# Patient Record
Sex: Female | Born: 1960 | Race: White | Hispanic: No | Marital: Married | Smoking: Current every day smoker
Health system: Southern US, Community
[De-identification: ages and names within clinical notes are randomized; demographics above are authoritative.]

## PROBLEM LIST (undated history)

## (undated) DIAGNOSIS — R32 Unspecified urinary incontinence: Secondary | ICD-10-CM

## (undated) DIAGNOSIS — Z8619 Personal history of other infectious and parasitic diseases: Secondary | ICD-10-CM

## (undated) DIAGNOSIS — Z8679 Personal history of other diseases of the circulatory system: Secondary | ICD-10-CM

## (undated) DIAGNOSIS — I471 Supraventricular tachycardia, unspecified: Secondary | ICD-10-CM

## (undated) DIAGNOSIS — D219 Benign neoplasm of connective and other soft tissue, unspecified: Secondary | ICD-10-CM

## (undated) DIAGNOSIS — N83209 Unspecified ovarian cyst, unspecified side: Secondary | ICD-10-CM

## (undated) DIAGNOSIS — I10 Essential (primary) hypertension: Secondary | ICD-10-CM

## (undated) DIAGNOSIS — D249 Benign neoplasm of unspecified breast: Secondary | ICD-10-CM

## (undated) DIAGNOSIS — N816 Rectocele: Secondary | ICD-10-CM

## (undated) DIAGNOSIS — F419 Anxiety disorder, unspecified: Secondary | ICD-10-CM

## (undated) DIAGNOSIS — E039 Hypothyroidism, unspecified: Secondary | ICD-10-CM

## (undated) DIAGNOSIS — F32A Depression, unspecified: Secondary | ICD-10-CM

## (undated) DIAGNOSIS — IMO0002 Reserved for concepts with insufficient information to code with codable children: Principal | ICD-10-CM

## (undated) DIAGNOSIS — F329 Major depressive disorder, single episode, unspecified: Secondary | ICD-10-CM

## (undated) HISTORY — DX: Hypothyroidism, unspecified: E03.9

## (undated) HISTORY — DX: Reserved for concepts with insufficient information to code with codable children: IMO0002

## (undated) HISTORY — DX: Essential (primary) hypertension: I10

## (undated) HISTORY — DX: Personal history of other diseases of the circulatory system: Z86.79

## (undated) HISTORY — DX: Supraventricular tachycardia, unspecified: I47.10

## (undated) HISTORY — DX: Major depressive disorder, single episode, unspecified: F32.9

## (undated) HISTORY — DX: Anxiety disorder, unspecified: F41.9

## (undated) HISTORY — DX: Unspecified urinary incontinence: R32

## (undated) HISTORY — DX: Personal history of other infectious and parasitic diseases: Z86.19

## (undated) HISTORY — DX: Unspecified ovarian cyst, unspecified side: N83.209

## (undated) HISTORY — DX: Depression, unspecified: F32.A

## (undated) HISTORY — DX: Benign neoplasm of unspecified breast: D24.9

## (undated) HISTORY — DX: Rectocele: N81.6

## (undated) HISTORY — DX: Benign neoplasm of connective and other soft tissue, unspecified: D21.9

## (undated) HISTORY — DX: Supraventricular tachycardia: I47.1

---

## 1994-03-16 HISTORY — PX: OTHER SURGICAL HISTORY: SHX169

## 2004-04-08 ENCOUNTER — Ambulatory Visit: Payer: Self-pay | Admitting: Cardiology

## 2004-04-15 ENCOUNTER — Ambulatory Visit: Payer: Self-pay | Admitting: Cardiology

## 2004-04-25 ENCOUNTER — Ambulatory Visit: Payer: Self-pay | Admitting: Cardiology

## 2005-04-07 ENCOUNTER — Ambulatory Visit: Payer: Self-pay | Admitting: Cardiology

## 2006-05-17 ENCOUNTER — Ambulatory Visit: Payer: Self-pay | Admitting: Cardiology

## 2007-04-12 ENCOUNTER — Ambulatory Visit (HOSPITAL_COMMUNITY): Admission: RE | Admit: 2007-04-12 | Discharge: 2007-04-12 | Payer: Self-pay | Admitting: Obstetrics & Gynecology

## 2007-04-21 ENCOUNTER — Other Ambulatory Visit: Admission: RE | Admit: 2007-04-21 | Discharge: 2007-04-21 | Payer: Self-pay | Admitting: Obstetrics and Gynecology

## 2007-05-20 ENCOUNTER — Ambulatory Visit (HOSPITAL_COMMUNITY): Admission: RE | Admit: 2007-05-20 | Discharge: 2007-05-20 | Payer: Self-pay | Admitting: Obstetrics & Gynecology

## 2007-07-28 ENCOUNTER — Ambulatory Visit: Payer: Self-pay | Admitting: Cardiology

## 2007-12-23 DIAGNOSIS — R0989 Other specified symptoms and signs involving the circulatory and respiratory systems: Secondary | ICD-10-CM | POA: Insufficient documentation

## 2007-12-23 DIAGNOSIS — I471 Supraventricular tachycardia: Secondary | ICD-10-CM | POA: Insufficient documentation

## 2008-01-12 ENCOUNTER — Ambulatory Visit (HOSPITAL_COMMUNITY): Admission: RE | Admit: 2008-01-12 | Discharge: 2008-01-12 | Payer: Self-pay | Admitting: Obstetrics & Gynecology

## 2008-02-16 ENCOUNTER — Encounter: Payer: Self-pay | Admitting: Cardiology

## 2008-08-22 ENCOUNTER — Ambulatory Visit: Payer: Self-pay | Admitting: Cardiology

## 2008-12-30 IMAGING — US US PELVIS COMPLETE MODIFY
1 series · 14 of 25 positions shown · non-contrast
Comparison: none

HISTORY: Secondary amenorrhea, LMP 01/08/2007

[Series 1: us pelvis complete modify · 0.26mm/px · 14 of 51 slices shown]
[im 1/51]
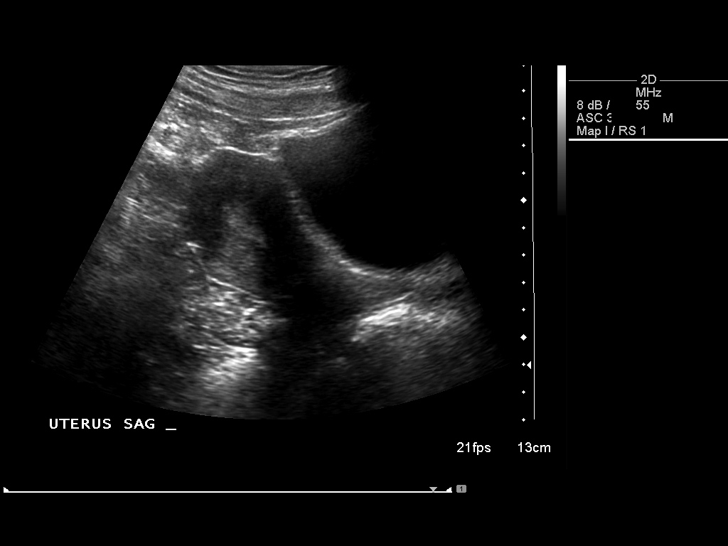
[im 5/51]
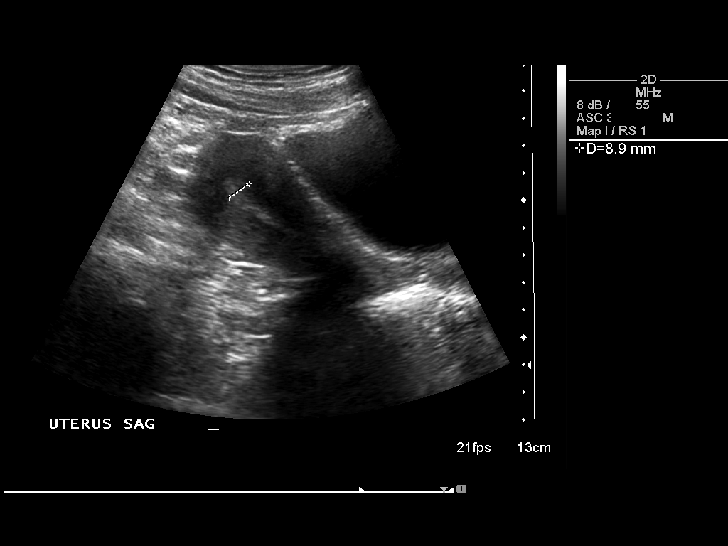
[im 9/51]
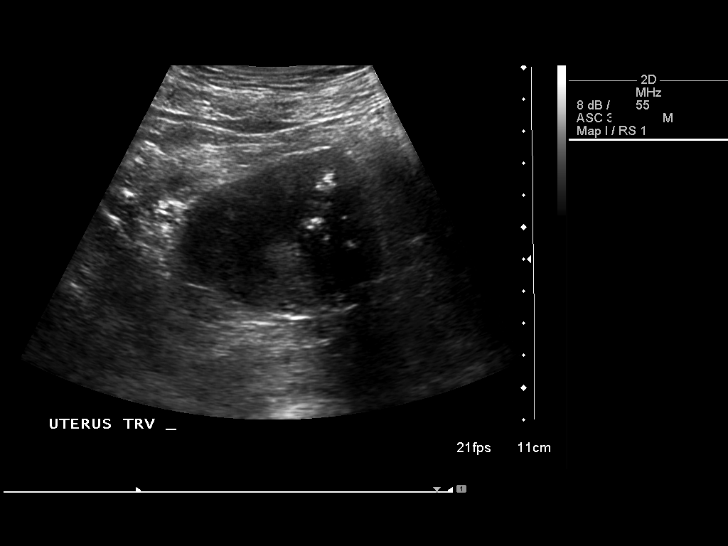
[im 13/51]
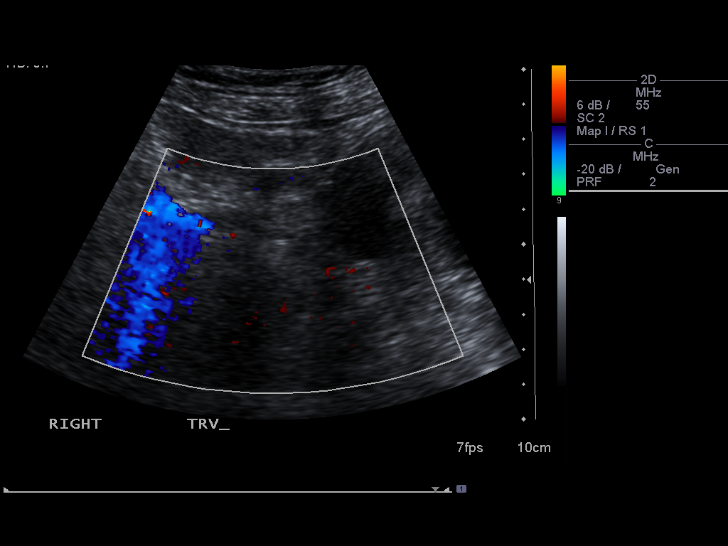
[im 17/51]
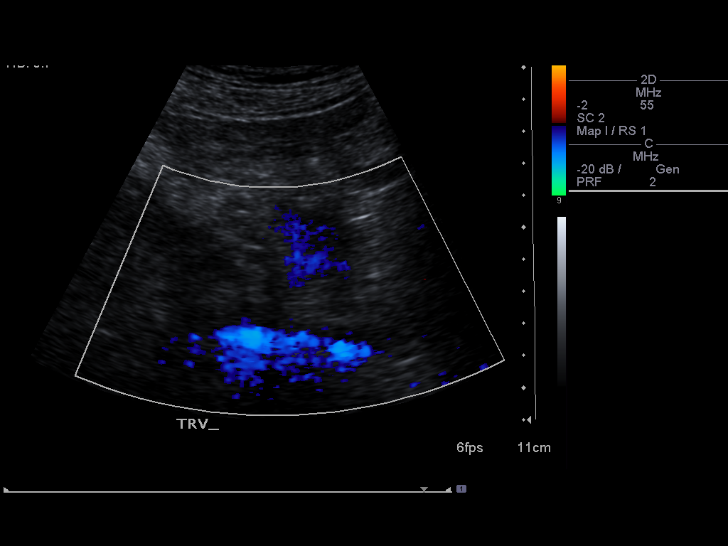
[im 19/51]
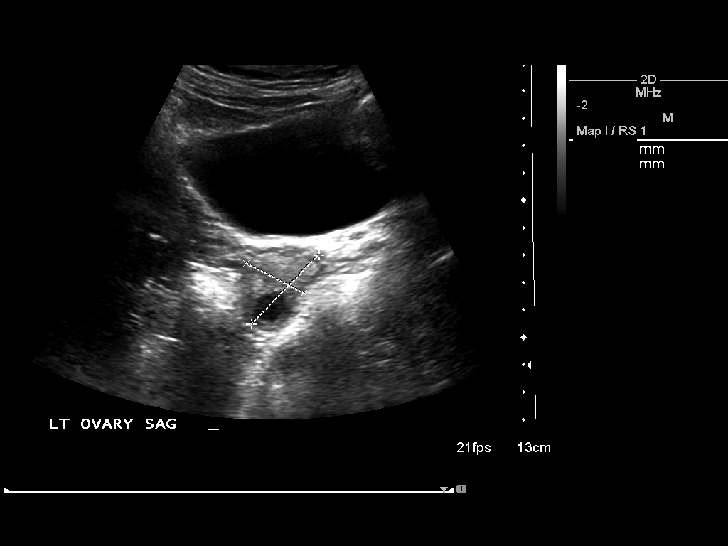
[im 23/51]
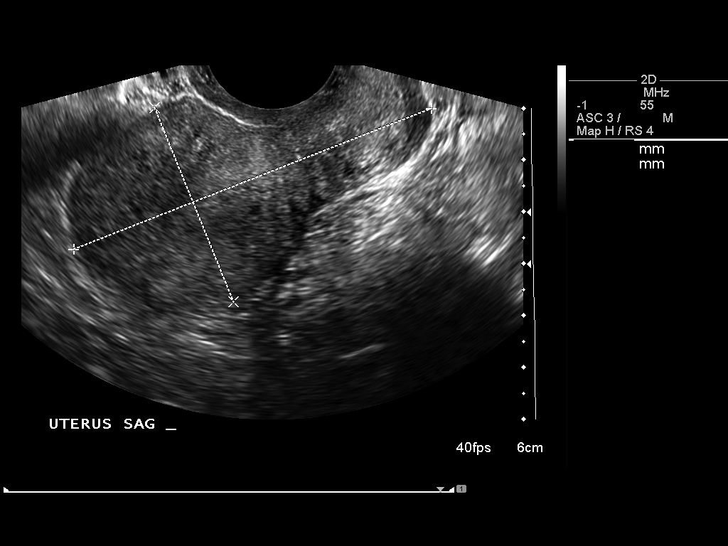
[im 28/51]
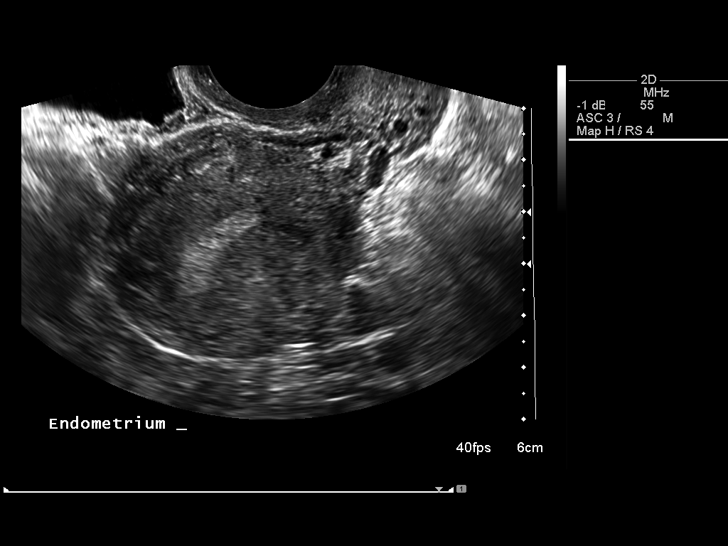
[im 32/51]
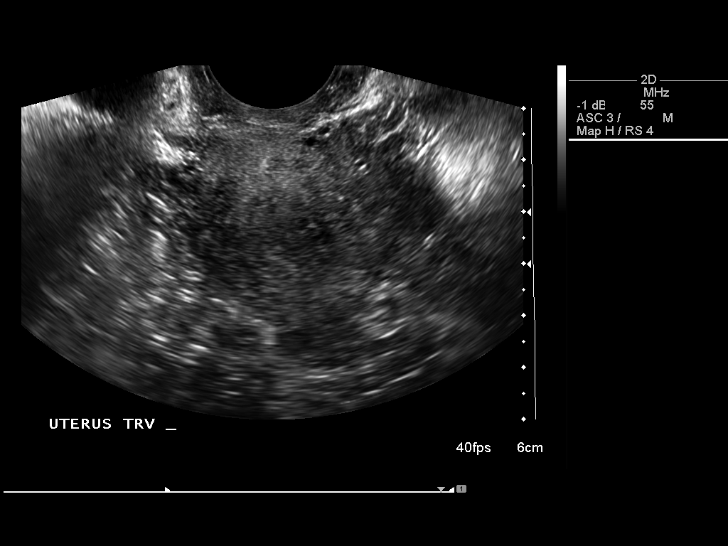
[im 34/51]
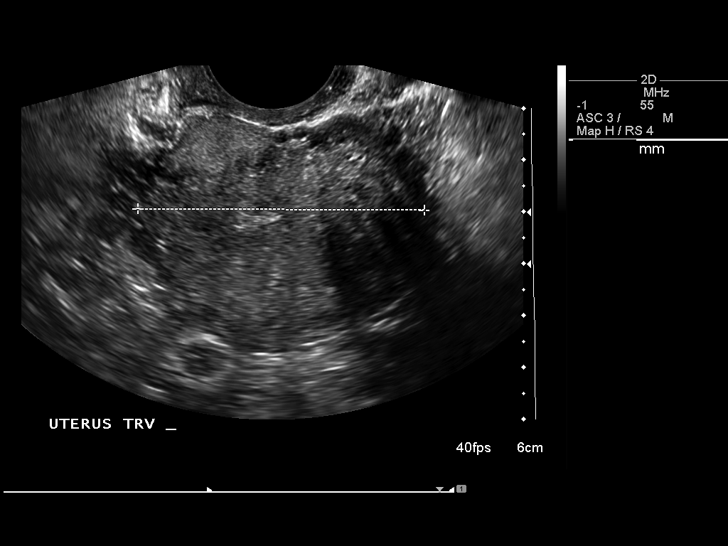
[im 38/51]
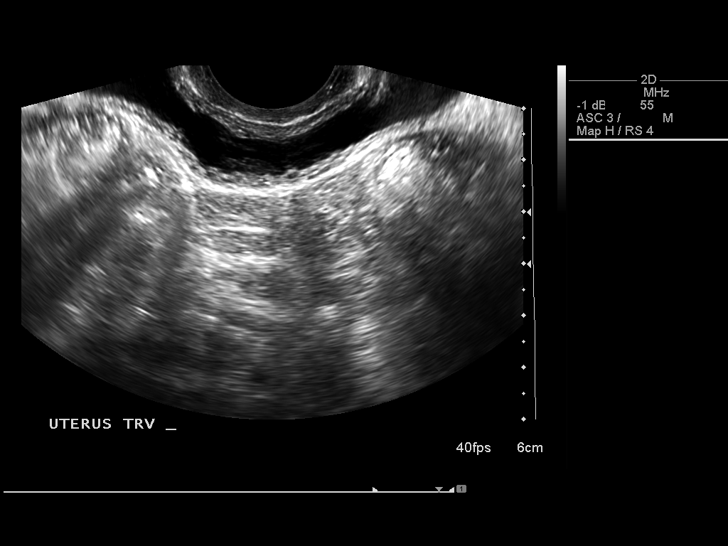
[im 42/51]
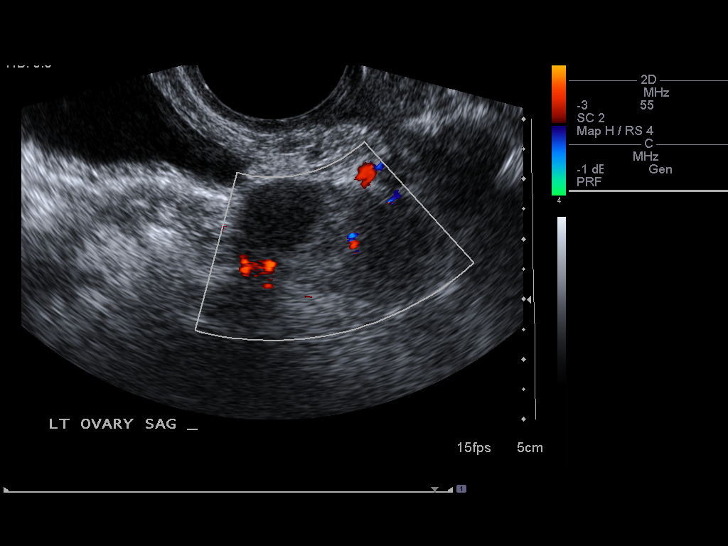
[im 46/51]
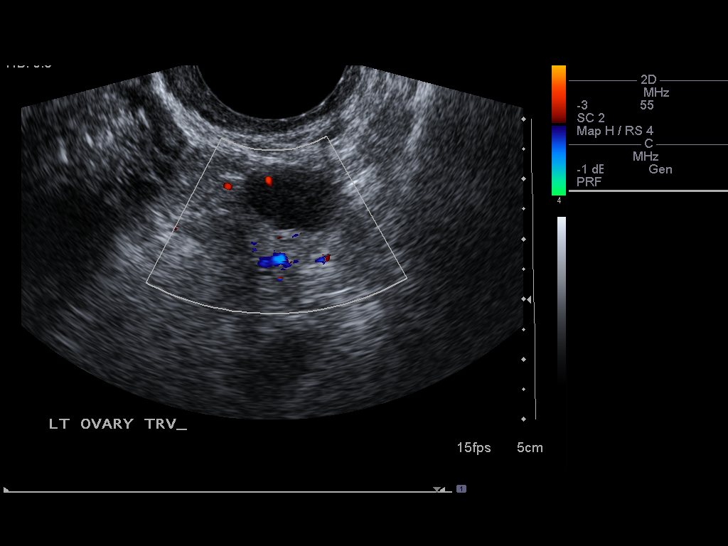
[im 51/51]
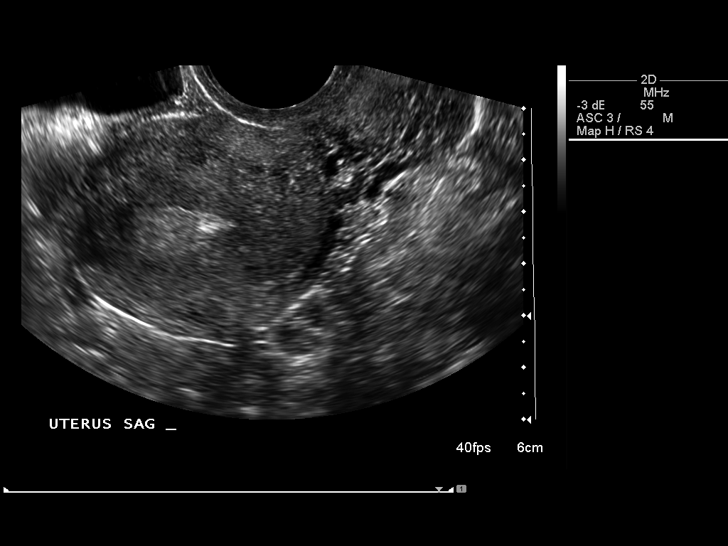

[14 of 25 positions shown; findings below may reference images not displayed]

ULTRASOUND PELVIS TRANSABDOMINAL COMPLETE MODIFIED:
ULTRASOUND PELVIS TRANSVAGINAL NON-OB:

Transabdominal and endovaginal sonography of pelvis performed.
Uterus, ovaries, adnexal regions, and pelvic cul-de-sac assessed.
At patient's request, transvaginal exam was terminated prematurely.

Uterus normal size, 8.8 cm length x 3.9 cm AP x 6.2 cm transverse.
Endometrial complex 7 mm thick, normal.
Small focal uterine mass anterior aspect left midupper uterine segment, 2.0 x
1.9 cm, likely uterine fibroid.
This nodule is not clearly delineated on sagittal views to accurately measure
length.
No endometrial fluid or free pelvic fluid.
Right ovary not identified on either transabdominal or endovaginal imaging.
Left ovary measures 2.9 x 1.8 x 2.5 cm.
Small complicated cyst left ovary 1.3 x 1.1 x 1.6 cm, containing diffuse
internal echoes conicity.
No other adnexal masses.
IMPRESSION: Nonvisualization right ovary.
2 cm diameter left uterine fibroid.
1.6 cm diameter complicated cyst left ovary, recommend followup ultrasound in 2
to 3 months to assess stability, in order to exclude ovarian tumor.

## 2009-05-20 ENCOUNTER — Encounter: Payer: Self-pay | Admitting: Cardiology

## 2009-07-17 ENCOUNTER — Other Ambulatory Visit: Admission: RE | Admit: 2009-07-17 | Discharge: 2009-07-17 | Payer: Self-pay | Admitting: Obstetrics and Gynecology

## 2009-08-20 ENCOUNTER — Ambulatory Visit: Payer: Self-pay | Admitting: Cardiology

## 2009-08-20 DIAGNOSIS — F17200 Nicotine dependence, unspecified, uncomplicated: Secondary | ICD-10-CM | POA: Insufficient documentation

## 2009-10-01 IMAGING — US US PELVIS COMPLETE MODIFY
1 series · 14 of 25 positions shown · non-contrast
Comparison: 05/20/2007

CLINICAL DATA: Left ovarian cyst and uterine fibroid

TRANSABDOMINAL AND TRANSVAGINAL ULTRASOUND OF PELVIS
TECHNIQUE: Both transabdominal and transvaginal ultrasound
examinations of the pelvis were performed including evaluation of
the uterus, ovaries, adnexal regions, and pelvic cul-de-sac.

[Series 1: us pelvis complete modify · 0.26mm/px · 14 of 48 slices shown]
[im 1/48]
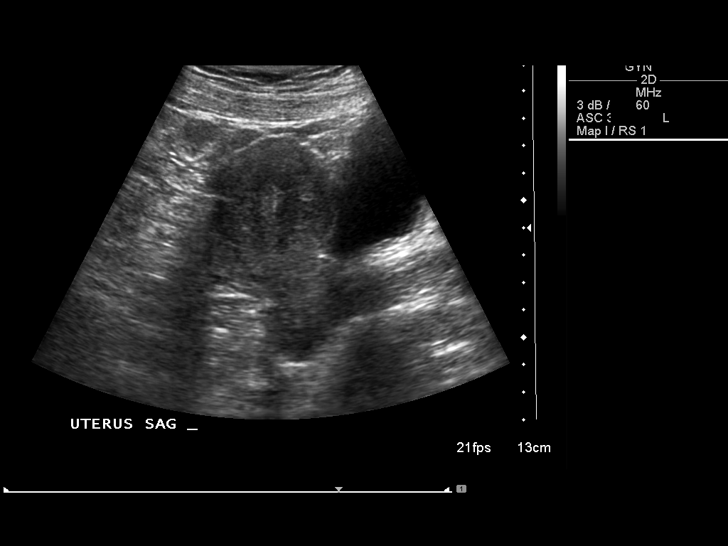
[im 4/48]
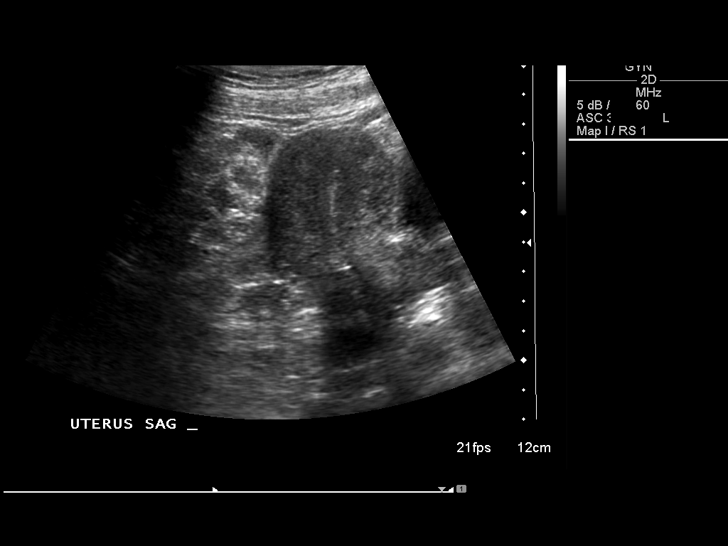
[im 8/48]
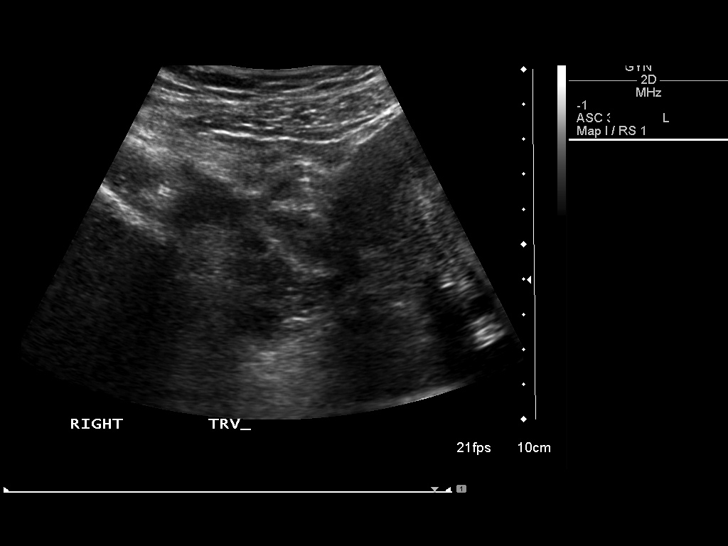
[im 12/48]
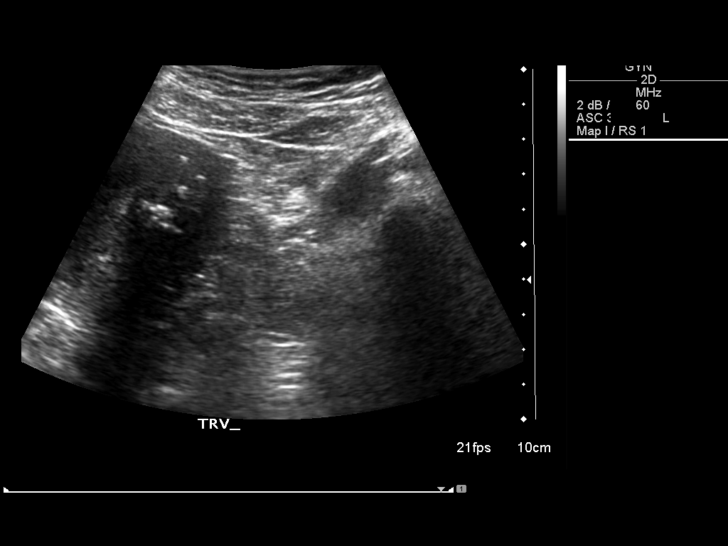
[im 16/48]
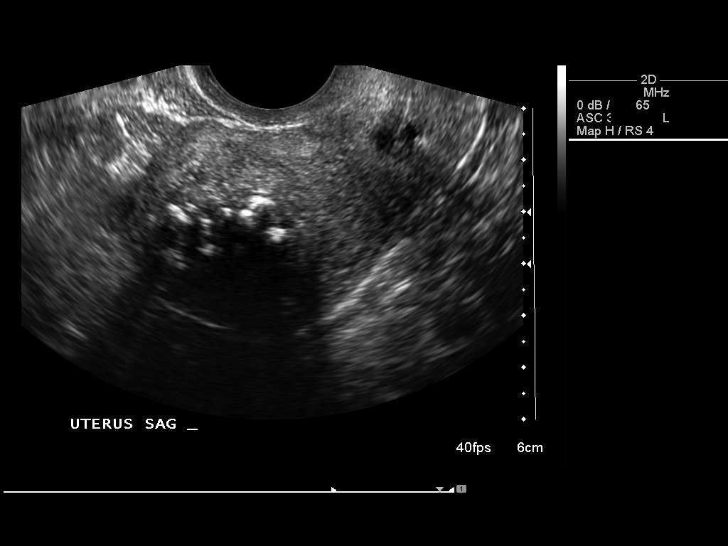
[im 18/48]
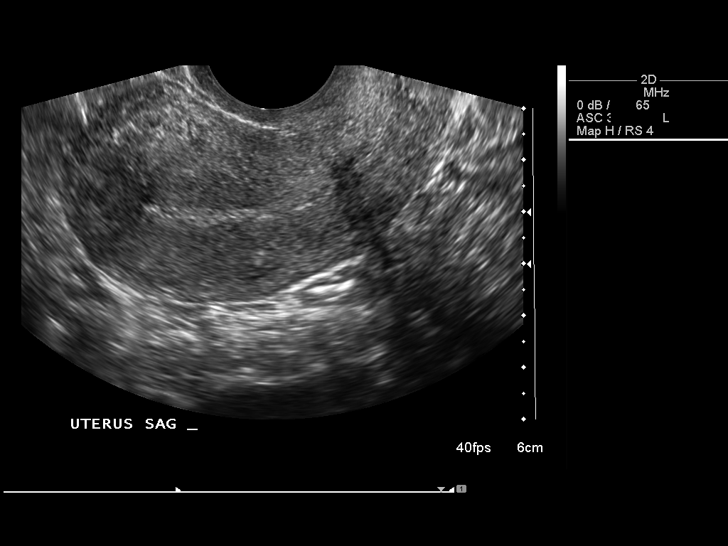
[im 22/48]
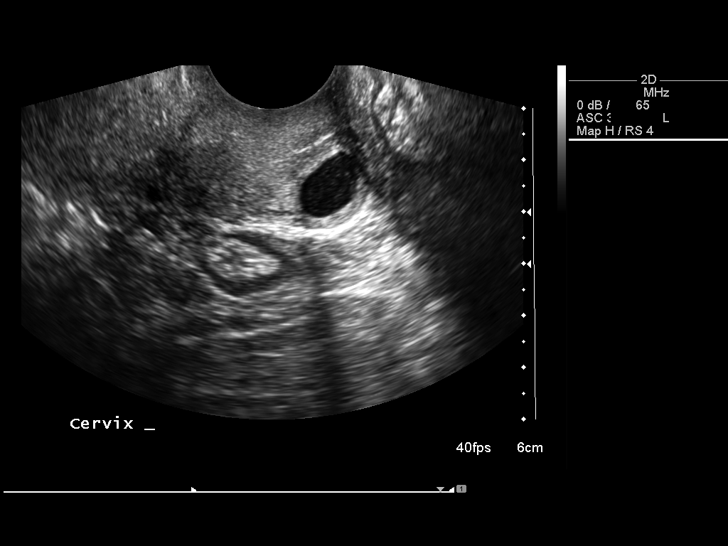
[im 26/48]
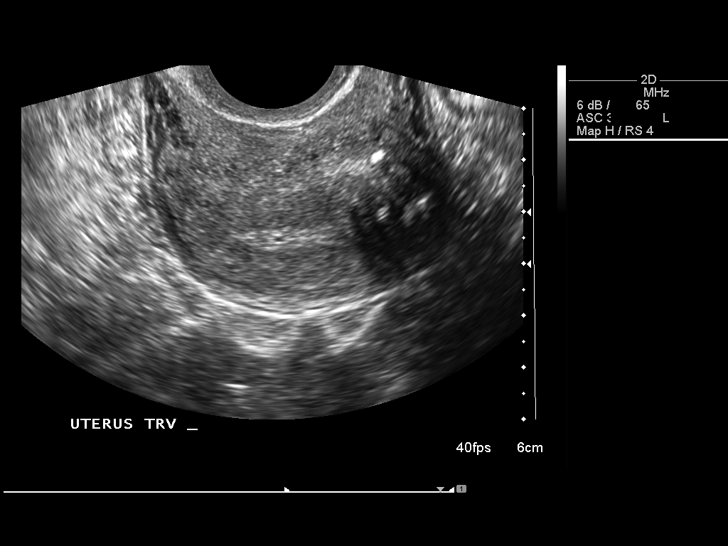
[im 30/48]
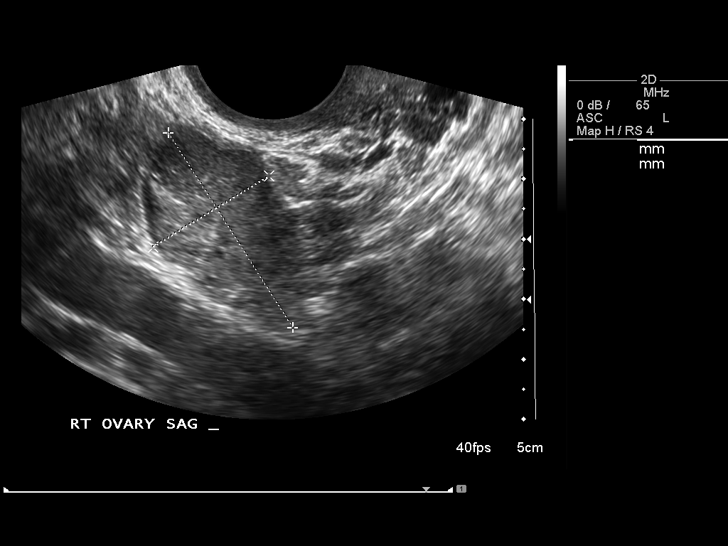
[im 32/48]
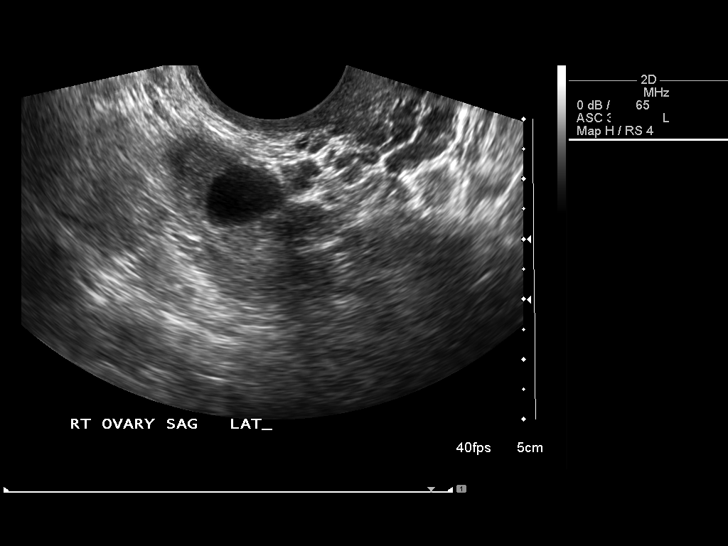
[im 36/48]
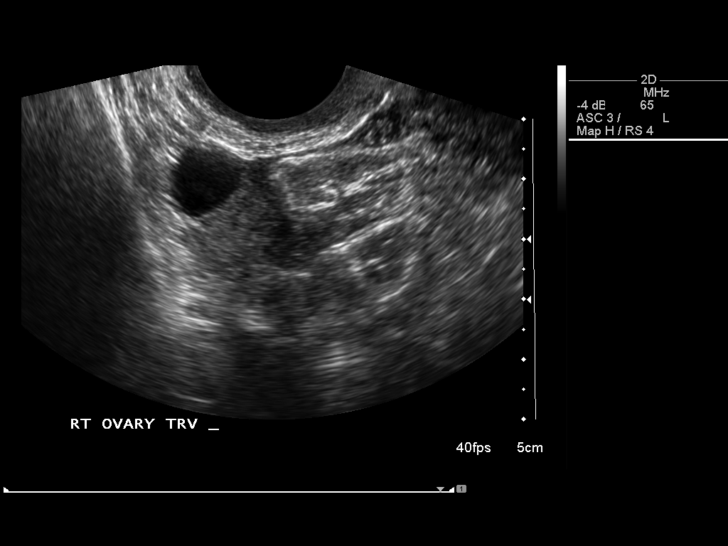
[im 40/48]
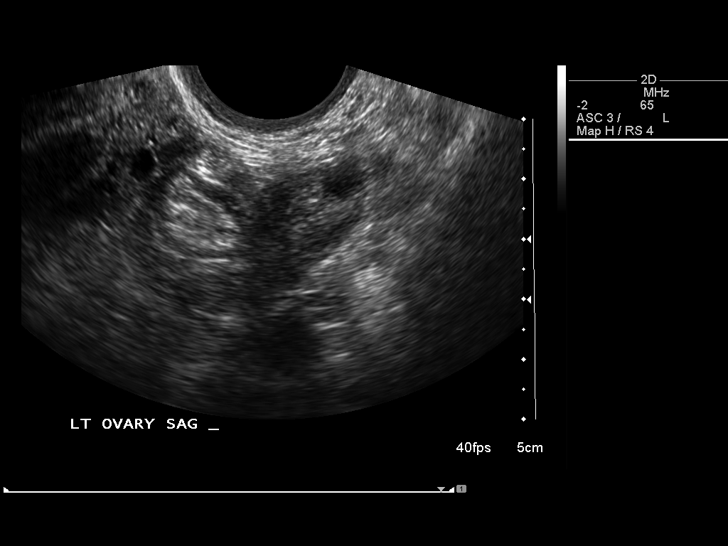
[im 44/48]
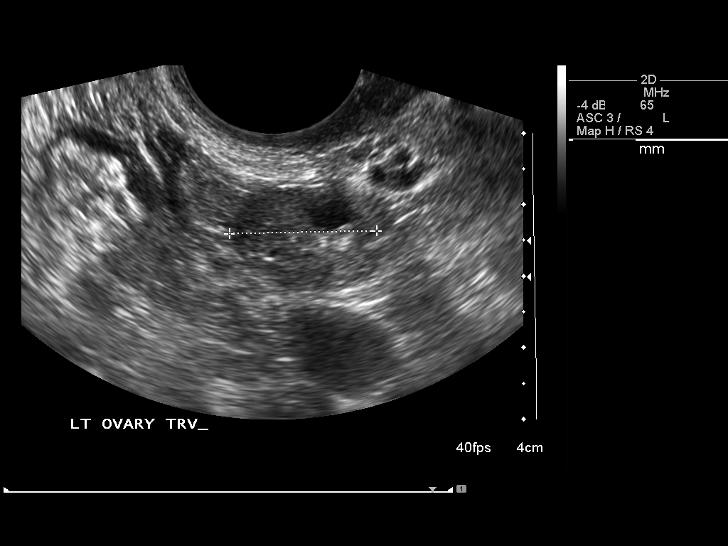
[im 48/48]
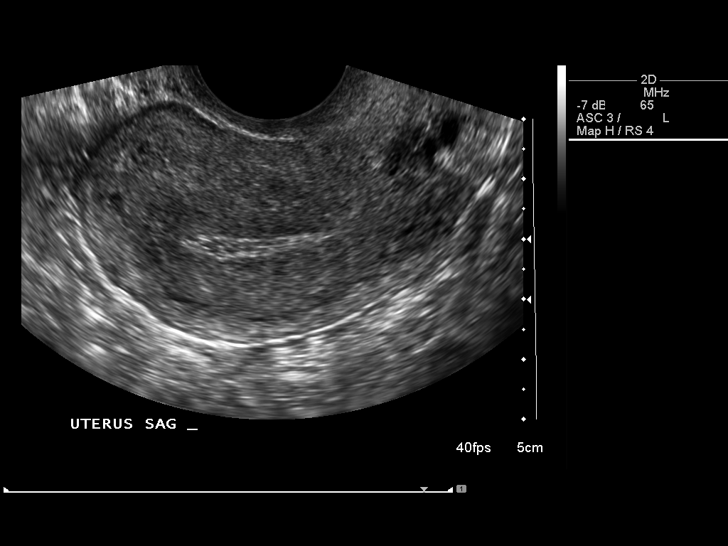

[14 of 25 positions shown; findings below may reference images not displayed]

FINDINGS: Uterus measures 7.9 cm length by 4.9 cm AP by 5.6 cm transverse.
Endometrial complex 8 mm thick.
Nabothian cysts in cervix.
Partially calcified degenerated uterine fibroid in left lateral
aspect of uterine fundus, measuring 2.6 x 3.3 x 3.5 cm.
No additional uterine masses or endometrial fluid.
No free pelvic fluid.
Right ovary measures 3.8 x 2.3 x 2.0 cm.
Small cyst right ovary 1.3 x 0.9 x 1.2 cm.
Left ovary measures 2.8 x 1.5 x 2.1 cm.
Tiny 6 mm follicle left ovary.
No other ovarian or adnexal masses.
IMPRESSION: Partially calcified/degenerated uterine fibroid.
Small right ovarian cyst, 1.3 cm diameter.
Unremarkable left ovary with resolution of previously identified
2.1 cm diameter complex cyst.

## 2010-04-06 ENCOUNTER — Encounter: Payer: Self-pay | Admitting: Obstetrics & Gynecology

## 2010-04-15 NOTE — Assessment & Plan Note (Signed)
Summary: 1 YR FU PER JUNE -SRS      Allergies Added: ! CODEINE ! PCN ! SULFA  Visit Type:  Follow-up Primary Provider:  Dr. Wyvonnia Lora   History of Present Illness: 50 year old woman presents for followup. She denies having any prolonged palpitations, chest pain, or syncope since her last visit. She does state that she has been diagnosed with perimenopausal symptoms in the interim, and does have some hot flashes on occasion.  She states she was diagnosed with a right breast fibroadenoma that is being followed at this point.  Recent labs from March revealed TSH 3.6 and free T4 0.67.  She continues to smoke cigarettes. I discussed smoking cessation with her again today.  Preventive Screening-Counseling & Management  Alcohol-Tobacco     Smoking Status: current     Smoking Cessation Counseling: yes     Packs/Day: <1/2 PPD  Current Medications (verified): 1)  Atenolol 50 Mg Tabs (Atenolol) .... Take 1 Tablet By Mouth Two Times A Day 2)  Levothyroxine Sodium 50 Mcg Tabs (Levothyroxine Sodium) .... Take 1 Tablet By Mouth Once A Day 3)  Wellbutrin Sr 150 Mg Xr12h-Tab (Bupropion Hcl) .... Take 1 Tablet By Mouth Once A Day  Allergies (verified): 1)  ! Codeine 2)  ! Pcn 3)  ! Sulfa  Comments:  Nurse/Medical Assistant: The patient's medication bottles and allergies were reviewed with the patient and were updated in the Medication and Allergy Lists.   Past History:  Social History: Last updated: 08/20/2009 Full Time Tobacco Use - Yes Alcohol Use - no  Past Medical History: Possible right ventricular tachycardia versus aberrantly conducted supraventricular tachycardia Depression Hypothyroidism  Past Surgical History: Removal of "boil" from leg 1996  Family History: Negative FH of Diabetes, Hypertension, or Coronary Artery Disease  Social History: Full Time Tobacco Use - Yes Alcohol Use - no Packs/Day:  <1/2 PPD  Review of Systems  The patient denies anorexia,  fever, chest pain, syncope, dyspnea on exertion, peripheral edema, melena, and hematochezia.         Otherwise reviewed and negative.  Vital Signs:  Patient profile:   50 year old female Height:      67 inches Weight:      152 pounds BMI:     23.89 Pulse rate:   71 / minute BP sitting:   118 / 79  (left arm) Cuff size:   regular  Vitals Entered By: Carlye Grippe (August 20, 2009 2:14 PM)  Physical Exam  Additional Exam:  Normally nourished appearing woman in no acute distress. HEENT: Conjunctiva and lids are normal, oropharynx with moist mucosa. Neck: Supple, no elevated JVP or bruits. Lungs: Clear auscultation, nonlabored. Cardiac: Regular rate and rhythm, no S3, no significant systolic murmur. Abdomen: Soft, nontender, bowel sounds present. Extremities: No pitting edema, distal pulses 2+.   EKG  Procedure date:  08/20/2009  Findings:      Normal sinus rhythm at 75 beats per minute.  Impression & Recommendations:  Problem # 1:  SVT/ PSVT/ PAT (ICD-427.0)  Stable with no progressive palpitations, dizziness, or syncope. Plan to continue beta blocker therapy. Annual followup, sooner if needed.  Her updated medication list for this problem includes:    Atenolol 50 Mg Tabs (Atenolol) .Marland Kitchen... Take 1 tablet by mouth two times a day  Orders: EKG w/ Interpretation (93000)  Problem # 2:  TOBACCO ABUSE (ICD-305.1)  I discussed smoking cessation with her today.  Patient Instructions: 1)  Your physician wants you to follow-up  in: 1 year. You will receive a reminder letter in the mail one-two months in advance. If you don't receive a letter, please call our office to schedule the follow-up appointment. 2)  Your physician recommends that you continue on your current medications as directed. Please refer to the Current Medication list given to you today.

## 2010-06-04 ENCOUNTER — Other Ambulatory Visit: Payer: Self-pay | Admitting: *Deleted

## 2010-06-04 ENCOUNTER — Encounter: Payer: Self-pay | Admitting: *Deleted

## 2010-06-04 DIAGNOSIS — I471 Supraventricular tachycardia, unspecified: Secondary | ICD-10-CM

## 2010-06-04 MED ORDER — ATENOLOL 50 MG PO TABS: 50.0000 mg | ORAL_TABLET | Freq: Two times a day (BID) | ORAL | Status: DC

## 2010-07-29 NOTE — Assessment & Plan Note (Signed)
**Tara Tara** [facility]                          Tara Tara   Tara Tara, Tara Tara                MRN:          [id number]  DATE:08/22/2008                            DOB:          April 24, 1960    PRIMARY CARE PHYSICIAN:  Wyvonnia Lora, MD   REASON FOR VISIT:  Annual followup.   HISTORY OF PRESENT ILLNESS:  Ms. Tara Tara (was Tara Tara) presents for a 1-  year visit.  She denies any problems with prolonged palpitations,  dizziness, or syncope.  She has only brief palpitations which are not  bothersome to her and do not limit her functionally.  She continues on  beta-blocker therapy.  Today's electrocardiogram is essentially normal,  showing sinus rhythm at 74 beats per minute.  She indicates that most of  the time she feels any palpitations is around her menstrual cycles.   ALLERGIES:  SULFA drugs, CODEINE, and PENICILLIN.   PRESENT MEDICATIONS:  1. Atenolol 50 mg p.o. b.i.d.  2. Levothyroxine 50 mcg p.o. daily.  3. Wellbutrin ER 150 mg p.o. daily.   REVIEW OF SYSTEMS:  As outlined above.   SOCIAL HISTORY:  She is smoking 4 or 5 cigarettes a day.  We discussed  smoking cessation.  She has not been able to quit entirely.  Otherwise,  other systems reviewed negative.   PHYSICAL EXAMINATION:  VITAL SIGNS:  Blood pressure is 116/78, heart  rate is 74 and regular, weight is 147 pounds which is stable.  GENERAL:  The patient is comfortable and in no acute distress.  NECK:  No elevated jugular venous pressure.  No loud bruits.  No  thyromegaly.  LUNGS:  Clear without labored breathing at rest.  CARDIAC:  Regular rate and rhythm.  No pathologic murmur.  No S3 gallop.  EXTREMITIES:  No significant pitting edema.   IMPRESSION AND RECOMMENDATIONS:  1. History of paroxysmal aberrantly conducted supraventricular      tachycardia versus possible right ventricular tachycardia,      quiescent on beta-blocker therapy and with previous documentation     of normal left ventricular systolic function and no clear evidence      of ischemic heart disease.  We will plan to continue observation,      and no specific change in medical      therapy unless she develops progressive symptoms.  Follow up will      be in 1 year.  2. Hypothyroidism, managed by Dr. Margo Common.     Jonelle Sidle, MD  Electronically Signed    SGM/MedQ  DD: 08/22/2008  DT: 08/23/2008  Job #: [id number]   cc:   Wyvonnia Lora

## 2010-07-29 NOTE — Assessment & Plan Note (Signed)
[facility]                          [facility]   [name], [name]                    MRN:          [id number]  DATE:07/28/2007                            DOB:          [birth year]    CARDIOLOGIST:  Dr. Simona Huh.   PRIMARY CARE PHYSICIAN:  Dr. Wyvonnia Mclean.   REASON FOR VISIT:  A 1-year followup.   HISTORY OF PRESENT ILLNESS:  [name] is a 46-year female patient  with a history of possible right ventricular tachycardia versus  aberrantly conducted SVT.  She has been controlled on beta-blocker  therapy.  She returns for 1-year followup.  She denies any recurrent  tachy palpitations.  She denies any chest heaviness or tightness.  She  denies any significant shortness of breath.  She denies any syncope or  near-syncope.   MEDICATIONS:  1. Atenolol 50 mg b.i.d.  2. Wellbutrin SR 150 mg daily.  3. Levothyroxine 50 mcg daily.   PHYSICAL EXAM:  She is a well-nourished well-developed female in no  acute distress.  Blood pressure 112/71, pulse 71, weight 146.6 pounds.  HEENT is normal.  NECK:  Without JVD.  CARDIAC:  Normal S1, S2.  Regular rate and rhythm.  LUNGS:  Clear to auscultation bilaterally.  ABDOMEN:  Soft, nontender.  EXTREMITIES:  Without edema.  NEUROLOGIC:  She is alert and oriented x3.  Cranial nerves II-XII  grossly intact.  VASCULAR:  Exam without carotid artery bruits bilaterally.   Electrocardiogram reveals sinus rhythm with a heart rate of 75, normal  axis, no acute changes.   IMPRESSION:  1. Possible right ventricular tachycardia versus aberrantly conducted      supraventricular tachycardia.      a.     Quiescent on beta-blocker therapy.  2. Treated hypothyroidism.  3. Depression.   PLAN:  1. [name] is doing well from a rhythm standpoint.  She will      continue on atenolol as directed.  2. We will see her back in 1 year's time for routine followup or      sooner p.r.n..  3. We  briefly discussed tobacco cessation today.  I have asked her to      try nicotine patches again if at all possible.      [name], PA-C  Electronically Signed      Jonelle Sidle, MD  Electronically Signed   SW/MedQ  DD: 07/28/2007  DT: 07/28/2007  Job #: [id number]   cc:   [name]

## 2010-08-01 NOTE — Assessment & Plan Note (Signed)
[facility]                          [facility]   BRITTINI, BRUBECK                      MRN:          [id number]  DATE:05/17/2006                            DOB:          December 18, 1960    PRIMARY CARE PHYSICIAN:  Dr. Wyvonnia Lora.   REASON FOR VISIT:  Follow up arrhythmia.   HISTORY OF PRESENT ILLNESS:  Ms. [name] was last in the office in  January 2007.  She has a history of possible right ventricular  tachycardia versus aberrantly conducted supraventricular tachycardia  without any history of syncope or frank chest pain.  She has had prior  cardiac testing including an exercise echocardiogram which revealed no  clearly inducible wall motion abnormalities to suggest ischemia but  documentation of the above described arrhythmia.  She has been discussed  with Dr. Ladona Ridgel in our group from an electrophysiologic perspective and  at this point is being treated with beta blocker therapy.  Since her  last visit she has had three brief episodes of a feeling of heat in  her chest and one associated sense of palpitations although these  symptoms typically last less than 1 minute.  She has had no prolonged  symptoms, no dizziness and no syncope.  Her electrocardiogram today  shows sinus rhythm with nonspecific ST changes and no marked changes  compared to the prior tracing January 2007.  I reviewed her medications  which are stable.   ALLERGIES:  SULFA DRUGS, CODEINE AND PENICILLINS.   PRESENT MEDICATIONS:  1. Atenolol 50 mg p.o. daily.  2. Levothyroxine 25 mcg p.o. daily.  3. Paxil 10 mg p.o. daily.   REVIEW OF SYSTEMS:  As described in history of present illness.   EXAMINATION:  VITAL SIGNS:  Blood pressure is 125/83, heart rate is 72,  weight is 156 pounds.  GENERAL:  The patient is comfortable and in no acute distress.  HEENT:  Conjunctivae and lids normal.  Oropharynx is clear.  NECK:  Supple without elevated jugular venous  pressure, without bruits,  no thyromegaly is noted.  LUNGS:  Clear without labored breathing at rest.  CARDIAC:  Exam reveals a regular rate and rhythm without loud murmur or  S3 gallop.  No pericardial rub.  ABDOMEN:  Soft, nontender.  EXTREMITIES:  No significant pitting edema.  SKIN:  Warm and dry.  Pulses 2+.  MUSCULOSKELETAL:  No kyphosis is noted.  NEUROPSYCHIATRIC:  The patient alert and oriented x3.   IMPRESSION AND RECOMMENDATION:  1. History of possible right ventricular origin tachycardia versus      aberrantly conducted supraventricular tachycardia.  This is being      managed with beta blocker therapy at this time and there has been      no problems with dizziness or syncope.  Plan will be to continue      observation and yearly followup assuming there are no major      progression in symptoms in the interim.  She was comfortable with      this.  2. Further plans to follow.     Jonelle Sidle, MD  Electronically Signed    SGM/MedQ  DD: 05/17/2006  DT: 05/17/2006  Job #: [id number]   cc:   Wyvonnia Lora

## 2010-11-28 ENCOUNTER — Encounter: Payer: Self-pay | Admitting: *Deleted

## 2010-11-28 NOTE — Progress Notes (Signed)
  Received pharmacy requested from [facility] for refill for Wellbutrin.   Prescription faxed back to [facility] with RX request denied.

## 2011-01-28 ENCOUNTER — Other Ambulatory Visit: Payer: Self-pay | Admitting: *Deleted

## 2011-01-28 DIAGNOSIS — I471 Supraventricular tachycardia, unspecified: Secondary | ICD-10-CM

## 2011-01-28 MED ORDER — ATENOLOL 50 MG PO TABS: 50.0000 mg | ORAL_TABLET | Freq: Two times a day (BID) | ORAL | Status: DC

## 2011-02-27 ENCOUNTER — Other Ambulatory Visit: Payer: Self-pay | Admitting: *Deleted

## 2011-02-27 DIAGNOSIS — I471 Supraventricular tachycardia: Secondary | ICD-10-CM

## 2011-02-27 MED ORDER — ATENOLOL 50 MG PO TABS: 50.0000 mg | ORAL_TABLET | Freq: Two times a day (BID) | ORAL | Status: DC

## 2011-03-18 ENCOUNTER — Other Ambulatory Visit: Payer: Self-pay | Admitting: *Deleted

## 2011-03-18 DIAGNOSIS — I471 Supraventricular tachycardia: Secondary | ICD-10-CM

## 2011-03-18 MED ORDER — ATENOLOL 50 MG PO TABS: 50.0000 mg | ORAL_TABLET | Freq: Two times a day (BID) | ORAL | Status: DC

## 2011-03-19 ENCOUNTER — Other Ambulatory Visit: Payer: Self-pay | Admitting: *Deleted

## 2011-03-19 DIAGNOSIS — I471 Supraventricular tachycardia, unspecified: Secondary | ICD-10-CM

## 2011-03-19 MED ORDER — ATENOLOL 50 MG PO TABS: 50.0000 mg | ORAL_TABLET | Freq: Two times a day (BID) | ORAL | Status: DC

## 2011-03-27 ENCOUNTER — Encounter: Payer: Self-pay | Admitting: *Deleted

## 2011-03-30 ENCOUNTER — Encounter: Payer: Self-pay | Admitting: Cardiology

## 2011-03-30 ENCOUNTER — Ambulatory Visit (INDEPENDENT_AMBULATORY_CARE_PROVIDER_SITE_OTHER): Payer: Federal, State, Local not specified - PPO | Admitting: Cardiology

## 2011-03-30 VITALS — BP 123/87 | HR 75 | Ht 67.0 in | Wt 157.0 lb

## 2011-03-30 DIAGNOSIS — I471 Supraventricular tachycardia: Secondary | ICD-10-CM

## 2011-03-30 NOTE — Assessment & Plan Note (Signed)
Continue strategy of observation on medical therapy. No changes made to atenolol dosing.

## 2011-03-30 NOTE — Progress Notes (Signed)
   Clinical Summary Ms. [name] is a 51 y.o.female presenting for followup. I saw her in June of 2011. Since that time, she reports no major progression in palpitations. Has been only occasionally, and for very brief duration. No dizziness or syncope. Otherwise reports no major functional limitations. Followup ECG is reviewed showing normal sinus rhythm. She has tolerated atenolol.   Allergies  Allergen Reactions  . Codeine     REACTION: made head feel funny  . Penicillins     REACTION: rash  . Sulfonamide Derivatives     REACTION: rash  . Ibuprofen Other (See Comments)    Facial swelling    Current Outpatient Prescriptions  Medication Sig Dispense Refill  . atenolol (TENORMIN) 50 MG tablet Take 1 tablet (50 mg total) by mouth 2 (two) times daily.  60 tablet  0  . buPROPion (WELLBUTRIN SR) 150 MG 12 hr tablet Take one by mouth daily        . levothyroxine (SYNTHROID, LEVOTHROID) 75 MCG tablet Take 75 mcg by mouth daily.        Past Medical History  Diagnosis Date  . Depression   . Hypothyroidism   . Paroxysmal supraventricular tachycardia     Social History [name] reports that she has been smoking Cigarettes.  She has a 15 pack-year smoking history. She has never used smokeless tobacco. [name] reports that she does not drink alcohol.  Review of Systems Otherwise negative.  Physical Examination Filed Vitals:   03/30/11 0857  BP: 123/87  Pulse: 75    Normally nourished appearing woman in no acute distress.  HEENT: Conjunctiva and lids are normal, oropharynx with moist mucosa.  Neck: Supple, no elevated JVP or bruits.  Lungs: Clear auscultation, nonlabored.  Cardiac: Regular rate and rhythm, no S3, no significant systolic murmur.  Abdomen: Soft, nontender, bowel sounds present.  Extremities: No pitting edema, distal pulses 2+.  ECG Reviewed in EMR.  Problem List and Plan

## 2011-03-30 NOTE — Patient Instructions (Addendum)
Your physician you to follow up in 1 year. You will receive a reminder letter in the mail one-two months in advance. If you don't receive a letter, please call our office to schedule the follow-up appointment. Your physician recommends that you continue on your current medications as directed. Please refer to the Current Medication list given to you today. Your physician discussed the hazards of tobacco use. Tobacco use cessation is recommended and techniques and options to help you quit were discussed. 

## 2011-05-04 ENCOUNTER — Other Ambulatory Visit: Payer: Self-pay | Admitting: *Deleted

## 2011-05-04 DIAGNOSIS — I471 Supraventricular tachycardia: Secondary | ICD-10-CM

## 2011-05-04 MED ORDER — ATENOLOL 50 MG PO TABS: 50.0000 mg | ORAL_TABLET | Freq: Two times a day (BID) | ORAL | Status: DC

## 2012-04-27 ENCOUNTER — Other Ambulatory Visit: Payer: Self-pay | Admitting: Cardiology

## 2012-04-27 DIAGNOSIS — R0989 Other specified symptoms and signs involving the circulatory and respiratory systems: Secondary | ICD-10-CM

## 2012-04-27 DIAGNOSIS — I471 Supraventricular tachycardia: Secondary | ICD-10-CM

## 2012-04-27 MED ORDER — ATENOLOL 50 MG PO TABS: 50.0000 mg | ORAL_TABLET | Freq: Two times a day (BID) | ORAL | Status: DC

## 2012-06-27 ENCOUNTER — Encounter: Payer: Self-pay | Admitting: Cardiology

## 2012-06-27 ENCOUNTER — Other Ambulatory Visit: Payer: Self-pay | Admitting: Cardiology

## 2012-06-27 ENCOUNTER — Encounter: Payer: Federal, State, Local not specified - PPO | Admitting: Cardiology

## 2012-06-27 DIAGNOSIS — R0989 Other specified symptoms and signs involving the circulatory and respiratory systems: Secondary | ICD-10-CM

## 2012-06-27 DIAGNOSIS — I471 Supraventricular tachycardia: Secondary | ICD-10-CM

## 2012-06-27 MED ORDER — ATENOLOL 50 MG PO TABS: 50.0000 mg | ORAL_TABLET | Freq: Two times a day (BID) | ORAL | Status: DC

## 2012-06-27 NOTE — Progress Notes (Signed)
NNo-show. This encounter was created in error - please disregard. 

## 2012-07-14 ENCOUNTER — Encounter: Payer: Self-pay | Admitting: Cardiology

## 2012-07-26 ENCOUNTER — Other Ambulatory Visit: Payer: Self-pay | Admitting: *Deleted

## 2012-07-26 DIAGNOSIS — R0989 Other specified symptoms and signs involving the circulatory and respiratory systems: Secondary | ICD-10-CM

## 2012-07-26 DIAGNOSIS — I471 Supraventricular tachycardia: Secondary | ICD-10-CM

## 2012-07-26 MED ORDER — ATENOLOL 50 MG PO TABS: 50.0000 mg | ORAL_TABLET | Freq: Two times a day (BID) | ORAL | Status: DC

## 2012-08-12 ENCOUNTER — Telehealth: Payer: Self-pay | Admitting: Cardiology

## 2012-08-12 ENCOUNTER — Other Ambulatory Visit: Payer: Self-pay | Admitting: *Deleted

## 2012-08-12 DIAGNOSIS — I471 Supraventricular tachycardia: Secondary | ICD-10-CM

## 2012-08-12 DIAGNOSIS — R0989 Other specified symptoms and signs involving the circulatory and respiratory systems: Secondary | ICD-10-CM

## 2012-08-12 MED ORDER — ATENOLOL 50 MG PO TABS: 50.0000 mg | ORAL_TABLET | Freq: Two times a day (BID) | ORAL | Status: DC

## 2012-08-12 NOTE — Telephone Encounter (Signed)
Spoke with [name] at the [place] office regarding this message. She states Tara Sierras LPN Dr. Verne Carrow nurse is aware of pt needing the medication  Refill today.

## 2012-08-12 NOTE — Telephone Encounter (Addendum)
Discussed below with patient.  Refill sent to [facility] for #60 + 1 refill.  Advised to keep OV for July with Dr. Diona Browner.  Patient verbalized understanding.

## 2012-08-12 NOTE — Telephone Encounter (Signed)
atenolol (TENORMIN) 50 MG tablet 30 tablet 0 07/26/2012 Sig - Route: Take 1 tablet (50 mg total) by mouth 2 (two) times daily. - Oral Class: Fax Notes to Pharmacy: Patient needs to call office and schedule one year appointment for future refills.   PATIENT IS OUT OF MEDICINE NOW AND NEEDS TO TAKE TONIGHT.  APPOINTMENT WAS MADE LAST MONTH FOR HER TO SEE [facility] IN July BUT PHARMACY TOLD HER WE DENIED HER REFILL REQUEST ON 07/26/12.  Send refill to [facility] asap please

## 2012-09-05 ENCOUNTER — Encounter: Payer: Self-pay | Admitting: *Deleted

## 2012-09-06 ENCOUNTER — Ambulatory Visit (INDEPENDENT_AMBULATORY_CARE_PROVIDER_SITE_OTHER): Payer: Federal, State, Local not specified - PPO | Admitting: Adult Health

## 2012-09-06 ENCOUNTER — Encounter: Payer: Self-pay | Admitting: Adult Health

## 2012-09-06 ENCOUNTER — Other Ambulatory Visit (HOSPITAL_COMMUNITY)
Admission: RE | Admit: 2012-09-06 | Discharge: 2012-09-06 | Disposition: A | Discharge status: Home / Self Care | Payer: Federal, State, Local not specified - PPO | Source: Ambulatory Visit | Attending: Adult Health | Admitting: Adult Health

## 2012-09-06 VITALS — BP 112/70 | HR 74 | Ht 66.5 in | Wt 159.0 lb

## 2012-09-06 DIAGNOSIS — R5383 Other fatigue: Secondary | ICD-10-CM

## 2012-09-06 DIAGNOSIS — D249 Benign neoplasm of unspecified breast: Secondary | ICD-10-CM | POA: Insufficient documentation

## 2012-09-06 DIAGNOSIS — Z1212 Encounter for screening for malignant neoplasm of rectum: Secondary | ICD-10-CM

## 2012-09-06 DIAGNOSIS — R32 Unspecified urinary incontinence: Secondary | ICD-10-CM | POA: Insufficient documentation

## 2012-09-06 DIAGNOSIS — Z1151 Encounter for screening for human papillomavirus (HPV): Secondary | ICD-10-CM | POA: Insufficient documentation

## 2012-09-06 DIAGNOSIS — E039 Hypothyroidism, unspecified: Secondary | ICD-10-CM | POA: Insufficient documentation

## 2012-09-06 DIAGNOSIS — R87810 Cervical high risk human papillomavirus (HPV) DNA test positive: Secondary | ICD-10-CM | POA: Insufficient documentation

## 2012-09-06 DIAGNOSIS — Z01419 Encounter for gynecological examination (general) (routine) without abnormal findings: Secondary | ICD-10-CM | POA: Insufficient documentation

## 2012-09-06 DIAGNOSIS — F419 Anxiety disorder, unspecified: Secondary | ICD-10-CM | POA: Insufficient documentation

## 2012-09-06 DIAGNOSIS — N816 Rectocele: Secondary | ICD-10-CM | POA: Insufficient documentation

## 2012-09-06 DIAGNOSIS — D241 Benign neoplasm of right breast: Secondary | ICD-10-CM

## 2012-09-06 LAB — HEMOCCULT GUIAC POC 1CARD (OFFICE)

## 2012-09-06 NOTE — Patient Instructions (Addendum)
Mammogram is 7/1 at 10 am at [facility] center be there 20 minutes early Do hemoccult cards and return Physical in 1 year Call in am for labs

## 2012-09-06 NOTE — Progress Notes (Signed)
Patient ID: [name], female   DOB: [birth year], 52 y.o.   MRN: [id number] History of Present Illness: Monalisa is a 52 year old white female, married in for a pap and physical.   Current Medications, Allergies, Past Medical History, Past Surgical History, Family History and Social History were reviewed in [name].     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     Physical Exam:BP 112/70  Pulse 74  Ht 5' 6.5" (1.689 m)  Wt 159 lb (72.122 kg)  BMI 25.28 kg/m2 General:  Well developed, well nourished, no acute distress Skin:  Warm and dry Neck:  Midline trachea, normal thyroid Lungs; Clear to auscultation bilaterally Breast:  No dominant palpable mass, retraction, or nipple discharge,history of right breast fibroadenoma, that needs follow up. Cardiovascular: Regular rate and rhythm Abdomen:  Soft, non tender, no hepatosplenomegaly Pelvic:  External genitalia is normal in appearance.  The vagina is normal in appearance.  The cervix is bulbous.Pap performed with HPV.  Uterus is felt to be normal size, shape, and contour.  No  adnexal masses or tenderness noted. Rectal: Good sphincter tone, no polyps, or hemorrhoids felt.  Hemoccult positive and she has a rectocele. Extremities:  No swelling noted, she has good pulses bilaterally in legs and negative Phalen's test and negative Tinel's test on hands/wrist. Psych:  Alert and cooperative and seems happy   Impression: Yearly gyn exam Hypothyroid Rectocele Urinary incontinence Anxiety History of right breast  fibroadenoma Positive hemoccult Body aches Fatigue  Plan: Check CBC,CMP,TSH Diagnostic mammogram and right breast US at the [facility] in [place] 7/1 at 10 am Will send 3 hemoccult cards with pt to do and return, if any + will refer for colonoscopy  At that time Review handout on rectocele and urinary incontinence Physical in 1 year

## 2012-09-07 ENCOUNTER — Telehealth: Payer: Self-pay | Admitting: Obstetrics and Gynecology

## 2012-09-07 LAB — TSH: TSH: 4.255 u[IU]/mL (ref 0.350–4.500)

## 2012-09-07 LAB — CBC
MCH: 30 pg (ref 26.0–34.0)
MCHC: 34.2 g/dL (ref 30.0–36.0)
MCV: 87.7 fL (ref 78.0–100.0)

## 2012-09-07 LAB — COMPREHENSIVE METABOLIC PANEL
AST: 16 U/L (ref 0–37)
Calcium: 9.6 mg/dL (ref 8.4–10.5)
Chloride: 107 mEq/L (ref 96–112)
Glucose, Bld: 91 mg/dL (ref 70–99)
Total Protein: 7 g/dL (ref 6.0–8.3)

## 2012-09-07 NOTE — Telephone Encounter (Signed)
Left message that labs were normal

## 2012-09-07 NOTE — Telephone Encounter (Signed)
Please review labs and let me know if you want me to call her. Thanks!!! [employer]

## 2012-09-08 ENCOUNTER — Telehealth: Payer: Self-pay | Admitting: Adult Health

## 2012-09-08 NOTE — Telephone Encounter (Signed)
Left message to call about pap 

## 2012-09-09 ENCOUNTER — Telehealth: Payer: Self-pay | Admitting: Adult Health

## 2012-09-09 NOTE — Telephone Encounter (Signed)
Left message to call about pap 

## 2012-09-12 ENCOUNTER — Telehealth: Payer: Self-pay | Admitting: *Deleted

## 2012-09-12 NOTE — Telephone Encounter (Signed)
Pt aware of +HPV to make appt.

## 2012-09-22 ENCOUNTER — Other Ambulatory Visit: Payer: Self-pay | Admitting: Cardiology

## 2012-09-22 ENCOUNTER — Ambulatory Visit (INDEPENDENT_AMBULATORY_CARE_PROVIDER_SITE_OTHER): Payer: Federal, State, Local not specified - PPO | Admitting: Adult Health

## 2012-09-22 ENCOUNTER — Encounter: Payer: Self-pay | Admitting: Adult Health

## 2012-09-22 ENCOUNTER — Telehealth: Payer: Self-pay | Admitting: Cardiology

## 2012-09-22 VITALS — BP 122/80 | Ht 67.0 in | Wt 158.0 lb

## 2012-09-22 DIAGNOSIS — R0989 Other specified symptoms and signs involving the circulatory and respiratory systems: Secondary | ICD-10-CM

## 2012-09-22 DIAGNOSIS — B977 Papillomavirus as the cause of diseases classified elsewhere: Secondary | ICD-10-CM

## 2012-09-22 DIAGNOSIS — I471 Supraventricular tachycardia: Secondary | ICD-10-CM

## 2012-09-22 DIAGNOSIS — IMO0002 Reserved for concepts with insufficient information to code with codable children: Secondary | ICD-10-CM | POA: Insufficient documentation

## 2012-09-22 DIAGNOSIS — R6889 Other general symptoms and signs: Secondary | ICD-10-CM

## 2012-09-22 MED ORDER — ATENOLOL 50 MG PO TABS: 50.0000 mg | ORAL_TABLET | Freq: Two times a day (BID) | ORAL | Status: DC

## 2012-09-22 NOTE — Progress Notes (Signed)
Subjective:     Patient ID: [name], female   DOB: [birth year], 52 y.o.   MRN: [id number]  HPI [name] is in for high risk HPV typing, her pap came back normal but ws + HPV.  Review of Systems See HPI Reviewed past medical,surgical, social and family history. Reviewed medications and allergies.     Objective:   Physical Exam BP 122/80  Ht 5\' 7"  (1.702 m)  Wt 158 lb (71.668 kg)  BMI 24.74 kg/m2   Skin warm and dry.Pelvic: external genitalia is normal in appearance, vagina: decrease color and rugae, fairly good moisture, cervix:smooth and bulbous, uterus: normal size, shape and contour, non tender, no masses felt, adnexa: no masses or tenderness noted. MDL one swab used to obtain specimen for HPV typing  Assessment:      +HPV on pap, in for typing    Plan:      HPV typing  Review handouts on HPV Follow up as scheduled and prn

## 2012-09-22 NOTE — Telephone Encounter (Signed)
Pharmacy called wanting refill for patients atenolol 50 mg bid. Gave verbal order to give patient 30 day supply with no refills.

## 2012-09-22 NOTE — Patient Instructions (Addendum)
Human Papillomavirus Human papillomavirus (HPV) is the most common sexually transmitted infection (STI) and is highly contagious. HPV infections cause genital warts and cancers to the outlet of the womb (cervix), birth canal (vagina), opening of the birth canal (vulva), and anus. There are over 100 types of HPV. Four types of HPV are responsible for causing 70% of all cervical cancers. Ninety percent of anal cancers and genital warts are caused by HPV. Unless you have wart-like lesions in the throat or genital warts that you can see or feel, HPV usually does not cause symptoms. Therefore, people can be infected for long periods and pass it on to others without knowing it. HPV in pregnancy usually does not cause a problem for the mother or baby. If the mother has genital warts, the baby rarely gets infected. When the HPV infection is found to be pre-cancerous on the cervix, vagina, or vulva, the mother will be followed closely during the pregnancy. Any needed treatment will be done after the baby is born. CAUSES   Having unprotected sex. HPV can be spread by oral, vaginal, or anal sexual activity.  Having several sex partners.  Having a sex partner who has other sex partners.  Having or having had another sexually transmitted infection. SYMPTOMS   More than 90% of people carrying HPV cannot tell anything is wrong.  Wart-like lesions in the throat (from having oral sex).  Warts in the infected skin or mucous membranes.  Genital warts may itch, burn, or bleed.  Genital warts may be painful or bleed during sexual intercourse. DIAGNOSIS   Genital warts are easily seen with the naked eye.  Currently, there is no FDA-approved test to detect HPV in males.  In females, a Pap test can show cells which are infected with HPV.  In females, a scope can be used to view the cervix (colposcopy). A colposcopy can be performed if the pelvic exam or Pap test is abnormal.  In females, a sample of tissue  may be removed (biopsy) during the colposcopy. TREATMENT   Treatment of genital warts can include:  Podophyllin lotion or gel.  Bichloroacetic acid (BCA) or trichloroacetic acid (TCA).  Podofilox solution or gel.  Imiquimod cream.  Interferon injections.  Use of a probe to apply extreme cold (cryotherapy).  Application of an intense beam of light (laser treatment).  Use of a probe to apply extreme heat (electrocautery).  Surgery.  HPV of the cervix, vagina, or vulva can be treated with:  Cryotherapy.  Laser treatment.  Electrocautery.  Surgery. Your caregiver will follow you closely after you are treated. This is because the HPV can come back and may need treatment again. HOME CARE INSTRUCTIONS   Follow your caregiver's instructions regarding medications, Pap tests, and follow-up exams.  Do not touch or scratch the warts.  Do not treat genital warts with medication used for treating hand warts.  Tell your sex partner about your infection because he or she may also need treatment.  Do not have sex while you are being treated.  After treatment, use condoms during sex to prevent future infections.  Have only 1 sex partner.  Have a sex partner who does not have other sex partners.  Use over-the-counter creams for itching or irritation as directed by your caregiver.  Use over-the-counter or prescription medicines for pain, discomfort, or fever as directed by your caregiver.  Do not douche or use tampons during treatment of HPV. PREVENTION   Talk to your caregiver about getting the HPV   vaccines. These vaccines prevent some HPV infections and cancers. It is recommended that the vaccine be given to males and females between the age of 78 and 60 years old. It will not work if you already have HPV and it is not recommended for pregnant women. The vaccines are not recommended for pregnant women.  Call your caregiver if you think you are pregnant and have the HPV.  A  PAP test is done to screen for cervical cancer.  The first PAP test should be done at age 45.  Between ages 67 and 76, PAP tests are repeated every 2 years.  Beginning at age 64, you are advised to have a PAP test every 3 years as long as your past 3 PAP tests have been normal.  Some women have medical problems that increase the chance of getting cervical cancer. Talk to your caregiver about these problems. It is especially important to talk to your caregiver if a new problem develops soon after your last PAP test. In these cases, your caregiver may recommend more frequent screening and Pap tests.  The above recommendations are the same for women who have or have not gotten the vaccine for HPV (Human Papillomavirus).  If you had a hysterectomy for a problem that was not a cancer or a condition that could lead to cancer, then you no longer need Pap tests. However, even if you no longer need a PAP test, a regular exam is a good idea to make sure no other problems are starting.   If you are between ages 27 and 23, and you have had normal Pap tests going back 10 years, you no longer need Pap tests. However, even if you no longer need a PAP test, a regular exam is a good idea to make sure no other problems are starting.  If you have had past treatment for cervical cancer or a condition that could lead to cancer, you need Pap tests and screening for cancer for at least 20 years after your treatment.  If Pap tests have been discontinued, risk factors (such as a new sexual partner)need to be re-assessed to determine if screening should be resumed.  Some women may need screenings more often if they are at high risk for cervical cancer. SEEK MEDICAL CARE IF:   The treated skin becomes red, swollen or painful.  You have an oral temperature above 102 F (38.9 C).  You feel generally ill.  You feel lumps or pimple-like projections in and around your genital area.  You develop bleeding of the  vagina or the treatment area.  You develop painful sexual intercourse. Document Released: 05/23/2003 Document Revised: 05/25/2011 Document Reviewed: 05/12/2007 [facility] Patient Information 2014 [place], [place]. Follow up as scheduled and prn

## 2012-09-23 ENCOUNTER — Telehealth: Payer: Self-pay | Admitting: Adult Health

## 2012-09-23 NOTE — Telephone Encounter (Signed)
Called pt to reminder her to do the 3 hemoccult cards that Isent home with her, she said she forgot but will do them.

## 2012-10-03 ENCOUNTER — Telehealth: Payer: Self-pay | Admitting: Adult Health

## 2012-10-03 NOTE — Telephone Encounter (Signed)
Left message that HPV high risk not detected and she still needs to 3 hemoccult cards

## 2012-10-12 ENCOUNTER — Ambulatory Visit (INDEPENDENT_AMBULATORY_CARE_PROVIDER_SITE_OTHER): Payer: Federal, State, Local not specified - PPO | Admitting: Cardiology

## 2012-10-12 ENCOUNTER — Encounter: Payer: Self-pay | Admitting: Cardiology

## 2012-10-12 VITALS — BP 121/84 | HR 75 | Ht 67.0 in | Wt 156.0 lb

## 2012-10-12 DIAGNOSIS — I471 Supraventricular tachycardia: Secondary | ICD-10-CM

## 2012-10-12 NOTE — Patient Instructions (Addendum)

## 2012-10-12 NOTE — Assessment & Plan Note (Signed)
Symptomatically well controlled on Tenormin. ECG is normal. No changes made.

## 2012-10-12 NOTE — Progress Notes (Signed)
   Clinical Summary [name] is a 52 y.o.female last seen in the office in January 2013. Other than being under psychosocial stress, she states she has been feeling well. No major progression in palpitations. No cardiac hospitalizations. Has occasional reflux symptoms that also give her a sense of fluttering in her chest. She continues on regular Tenormin. Her ECG today is normal,   Allergies  Allergen Reactions  . Codeine     REACTION: made head feel funny  . Penicillins     REACTION: rash  . Sulfonamide Derivatives     REACTION: rash  . Ibuprofen Other (See Comments)    Facial swelling    Current Outpatient Prescriptions  Medication Sig Dispense Refill  . acetaminophen (TYLENOL) 500 MG tablet Take 1,000 mg by mouth as needed for pain.      Marland Kitchen atenolol (TENORMIN) 50 MG tablet TAKE 1 TABLET BY MOUTH TWICE DAILY  60 tablet  1  . buPROPion (WELLBUTRIN SR) 150 MG 12 hr tablet Take one by mouth daily        . levothyroxine (SYNTHROID, LEVOTHROID) 75 MCG tablet Take 75 mcg by mouth daily.      . ranitidine (ZANTAC) 75 MG tablet Take 75 mg by mouth as needed for heartburn.       No current facility-administered medications for this visit.    Past Medical History  Diagnosis Date  . Depression   . Hypothyroidism   . Paroxysmal supraventricular tachycardia   . Anxiety   . Hypertension     PIH  . Fibroids   . Ovarian cyst   . Breast disorder 2012    RIght breast hypoechic area  . Rectocele 09/06/2012  . Urinary incontinence 09/06/2012  . 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 HPV test positive     Social History [name] reports that she has been smoking Cigarettes.  She has a 30 pack-year smoking history. She has never used smokeless tobacco. [name] reports that she does not drink alcohol.  Review of Systems Negative except as outlined.  Physical Examination Filed Vitals:   10/12/12 1334  BP: 121/84  Pulse: 75   Filed Weights   10/12/12 1334  Weight: 156 lb (70.761  kg)    Normally nourished appearing woman in no acute distress.  HEENT: Conjunctiva and lids are normal, oropharynx with moist mucosa.  Neck: Supple, no elevated JVP or bruits.  Lungs: Clear auscultation, nonlabored.  Cardiac: Regular rate and rhythm, no S3, no significant systolic murmur.  Abdomen: Soft, nontender, bowel sounds present.  Extremities: No pitting edema, distal pulses 2+.   Problem List and Plan   SVT/ PSVT/ PAT Symptomatically well controlled on Tenormin. ECG is normal. No changes made.    Jonelle Sidle, M.D., F.A.C.C.

## 2012-10-17 ENCOUNTER — Encounter: Payer: Self-pay | Admitting: Adult Health

## 2012-10-17 ENCOUNTER — Telehealth: Payer: Self-pay | Admitting: Adult Health

## 2012-10-17 NOTE — Telephone Encounter (Signed)
Pt aware all 3 hemoccult  Cards negative.

## 2012-10-17 NOTE — Progress Notes (Signed)
Patient ID: [name], female   DOB: [birth year], 52 y.o.   MRN: [id number] Pt returned 3 hemoccults and they were all negative.Pt is aware. Adline Potter, NP 10/17/2012 9:10 AM

## 2013-05-31 ENCOUNTER — Telehealth: Payer: Self-pay | Admitting: *Deleted

## 2013-05-31 NOTE — Telephone Encounter (Signed)
Patient requesting a safe medication to take OTC for watery eyes, runny nose and sneezing that will be okay to take with atenolol.   Nurse returned call and informed patient that loratadine is safe to take with atenolol.

## 2013-09-11 ENCOUNTER — Other Ambulatory Visit (HOSPITAL_COMMUNITY)
Admission: RE | Admit: 2013-09-11 | Discharge: 2013-09-11 | Disposition: A | Discharge status: Home / Self Care | Payer: Federal, State, Local not specified - PPO | Source: Ambulatory Visit | Attending: Adult Health | Admitting: Adult Health

## 2013-09-11 ENCOUNTER — Ambulatory Visit (INDEPENDENT_AMBULATORY_CARE_PROVIDER_SITE_OTHER): Payer: Federal, State, Local not specified - PPO | Admitting: Adult Health

## 2013-09-11 ENCOUNTER — Encounter: Payer: Self-pay | Admitting: Adult Health

## 2013-09-11 VITALS — BP 110/62 | HR 74 | Ht 66.5 in | Wt 157.5 lb

## 2013-09-11 DIAGNOSIS — Z1151 Encounter for screening for human papillomavirus (HPV): Secondary | ICD-10-CM | POA: Insufficient documentation

## 2013-09-11 DIAGNOSIS — Z8619 Personal history of other infectious and parasitic diseases: Secondary | ICD-10-CM | POA: Insufficient documentation

## 2013-09-11 DIAGNOSIS — R32 Unspecified urinary incontinence: Secondary | ICD-10-CM

## 2013-09-11 DIAGNOSIS — Z1212 Encounter for screening for malignant neoplasm of rectum: Secondary | ICD-10-CM

## 2013-09-11 DIAGNOSIS — Z01419 Encounter for gynecological examination (general) (routine) without abnormal findings: Secondary | ICD-10-CM

## 2013-09-11 DIAGNOSIS — R87810 Cervical high risk human papillomavirus (HPV) DNA test positive: Secondary | ICD-10-CM | POA: Insufficient documentation

## 2013-09-11 LAB — HEMOCCULT GUIAC POC 1CARD (OFFICE): FECAL OCCULT BLD: NEGATIVE

## 2013-09-11 NOTE — Patient Instructions (Addendum)
Physical in 1 year Mammogram yearly  Colonoscopy advised Labs with PCP Urinary Incontinence Urinary incontinence is the involuntary loss of urine from your bladder. CAUSES  There are many causes of urinary incontinence. They include:  Medicines.  Infections.  Prostatic enlargement, leading to overflow of urine from your bladder.  Surgery.  Neurological diseases.  Emotional factors. SIGNS AND SYMPTOMS Urinary Incontinence can be divided into four types: 1. Urge incontinence. Urge incontinence is the involuntary loss of urine before you have the opportunity to go to the bathroom. There is a sudden urge to void but not enough time to reach a bathroom. 2. Stress incontinence. Stress incontinence is the sudden loss of urine with any activity that forces urine to pass. It is commonly caused by anatomical changes to the pelvis and sphincter areas of your body. 3. Overflow incontinence. Overflow incontinence is the loss of urine from an obstructed opening to your bladder. This results in a backup of urine and a resultant buildup of pressure within the bladder. When the pressure within the bladder exceeds the closing pressure of the sphincter, the urine overflows, which causes incontinence, similar to water overflowing a dam. 4. Total incontinence. Total incontinence is the loss of urine as a result of the inability to store urine within your bladder. DIAGNOSIS  Evaluating the cause of incontinence may require:  A thorough and complete medical and obstetric history.  A complete physical exam.  Laboratory tests such as a urine culture and sensitivities. When additional tests are indicated, they can include:  An ultrasound exam.  Kidney and bladder X-rays.  Cystoscopy. This is an exam of the bladder using a narrow scope.  Urodynamic testing to test the nerve function to the bladder and sphincter areas. TREATMENT  Treatment for urinary incontinence depends on the cause:  For urge  incontinence caused by a bacterial infection, antibiotics will be prescribed. If the urge incontinence is related to medicines you take, your health care provider may have you change the medicine.  For stress incontinence, surgery to re-establish anatomical support to the bladder or sphincter, or both, will often correct the condition.  For overflow incontinence caused by an enlarged prostate, an operation to open the channel through the enlarged prostate will allow the flow of urine out of the bladder. In women with fibroids, a hysterectomy may be recommended.  For total incontinence, surgery on your urinary sphincter may help. An artificial urinary sphincter (an inflatable cuff placed around the urethra) may be required. In women who have developed a hole-like passage between their bladder and vagina (vesicovaginal fistula), surgery to close the fistula often is required. HOME CARE INSTRUCTIONS  Normal daily hygiene and the use of pads or adult diapers that are changed regularly will help prevent odors and skin damage.  Avoid caffeine. It can overstimulate your bladder.  Use the bathroom regularly. Try about every 2-3 hours to go to the bathroom, even if you do not feel the need to do so. Take time to empty your bladder completely. After urinating, wait a minute. Then try to urinate again.  For causes involving nerve dysfunction, keep a log of the medicines you take and a journal of the times you go to the bathroom. SEEK MEDICAL CARE IF:  You experience worsening of pain instead of improvement in pain after your procedure.  Your incontinence becomes worse instead of better. SEE IMMEDIATE MEDICAL CARE IF:  You experience fever or shaking chills.  You are unable to pass your urine.  You have  redness spreading into your groin or down into your thighs. MAKE SURE YOU:   Understand these instructions.   Will watch your condition.  Will get help right away if you are not doing well or  get worse. Document Released: 04/09/2004 Document Revised: 12/21/2012 Document Reviewed: 08/09/2012 [facility] Patient Information 2015 [place], [place]. This information is not intended to replace advice given to you by your health care provider. Make sure you discuss any questions you have with your health care provider. Do kegels

## 2013-09-11 NOTE — Progress Notes (Signed)
Patient ID: [name], female   DOB: January 13, 1961, 53 y.o.   MRN: [id number] History of Present Illness: [name] is a 53 year old white female, married in for a pap and physical, her last pap was +HPV with typing negative 16/18.She complains of some urinary incontinence esp if has to go and stands up.   Current Medications, Allergies, Past Medical History, Past Surgical History, Family History and Social History were reviewed in [employer] record.     Review of Systems: Patient denies any headaches, blurred vision, shortness of breath, chest pain, abdominal pain, problems with bowel movements,or intercourse(not having often). No joint pain, thighs ache at times, no mood swings or hot flashes, see HPI for positives.    Physical Exam:BP 110/62  Pulse 74  Ht 5' 6.5" (1.689 m)  Wt 157 lb 8 oz (71.442 kg)  BMI 25.04 kg/m2 General:  Well developed, well nourished, no acute distress Skin:  Warm and dry Neck:  Midline trachea, normal thyroid Lungs; Clear to auscultation bilaterally Breast:  No dominant palpable mass, retraction, or nipple discharge Cardiovascular: Regular rate and rhythm Abdomen:  Soft, non tender, no hepatosplenomegaly Pelvic:  External genitalia is normal in appearance for age, has 1 area of folliculitis.  The vagina has decrease color, moisture and rugae. The cervix is bulbous and smooth,pap with HPV performed.  Uterus is felt to be normal size, shape, and contour.  No   adnexal masses or tenderness noted. Rectal: Good sphincter tone, no polyps, or hemorrhoids felt.  Hemoccult negative.+rectocele Extremities:  No swelling or varicosities noted Psych:  No mood changes,alert and cooperative,seems happy   Impression: Yearly gyn exam History of +HPV UI    Plan: Void often, decrease caffeine, do kegels Physical in 1 year Mammogram yearly Colonoscopy advised Labs with PCP Review handout on UI

## 2013-09-13 LAB — CYTOLOGY - PAP

## 2013-09-18 ENCOUNTER — Telehealth: Payer: Self-pay | Admitting: Adult Health

## 2013-09-18 NOTE — Telephone Encounter (Signed)
Pt aware of pap and +HPV repeat next year

## 2014-01-15 ENCOUNTER — Encounter: Payer: Self-pay | Admitting: Adult Health

## 2014-01-30 ENCOUNTER — Ambulatory Visit (INDEPENDENT_AMBULATORY_CARE_PROVIDER_SITE_OTHER): Payer: Federal, State, Local not specified - PPO | Admitting: Podiatry

## 2014-01-30 ENCOUNTER — Encounter: Payer: Self-pay | Admitting: Podiatry

## 2014-01-30 VITALS — BP 131/76 | HR 67 | Resp 16 | Ht 67.0 in | Wt 150.0 lb

## 2014-01-30 DIAGNOSIS — L603 Nail dystrophy: Secondary | ICD-10-CM

## 2014-01-30 DIAGNOSIS — B351 Tinea unguium: Secondary | ICD-10-CM

## 2014-01-30 NOTE — Progress Notes (Signed)
   Subjective:    Patient ID: [name], female    DOB: November 05, 1960, 53 y.o.   MRN: [id number]  HPI Comments: Left great toenail i think i have a fungus, been using vinegar soaks. It is gross to look at. i would like medicine to take care of it . It started on the left great and now is on the second toe right foot      Review of Systems  All other systems reviewed and are negative.      Objective:   Physical Exam : I have reviewed her past medical history medications allergies surgery social history and review of systems. Pulses are strongly palpable. Neurologic sensorium is intact per Semmes-Weinstein monofilament. Tendon reflexes intact bilateral muscle strength is 5 over 5 dorsiflexion and plantar flexors inverters and everters all intrinsic musculature is intact. Orthopedic evaluation due to his mild hammertoe deformity to reform is bilateral but are asymptomatic. Cutaneous evaluation demonstrates no erythema edema sailors drainage or odor however her nails to the hallux bilateral and the second digit right foot demonstrated thickening and discoloration consistent with nail dystrophy or onychomycosis. She also has what appears to be tinea pedis to the hallux left.       Assessment & Plan:  Assessment: Tinea pedis hallux left possible onychomycosis rule out nail dystrophy bilateral foot 3 toes.  Plan: Nail and skin samples were taken today for pathologic evaluation will notify her of the results.

## 2014-01-31 ENCOUNTER — Telehealth: Payer: Self-pay | Admitting: *Deleted

## 2014-01-31 NOTE — Telephone Encounter (Signed)
Toenails sent to [facility].

## 2014-02-23 ENCOUNTER — Telehealth: Payer: Self-pay | Admitting: *Deleted

## 2014-02-23 NOTE — Telephone Encounter (Signed)
Patient called wanting results of nail clippings that were taken on 01-30-14.

## 2014-02-23 NOTE — Telephone Encounter (Signed)
We did not have results from [facility] yet. Called for results. Patient needs appointment. Forward to schedulers to make appointment with Dr. Milinda Pointer.

## 2014-02-28 ENCOUNTER — Encounter: Payer: Self-pay | Admitting: Podiatry

## 2014-03-01 ENCOUNTER — Ambulatory Visit (INDEPENDENT_AMBULATORY_CARE_PROVIDER_SITE_OTHER): Payer: Federal, State, Local not specified - PPO | Admitting: Podiatry

## 2014-03-01 DIAGNOSIS — B351 Tinea unguium: Secondary | ICD-10-CM

## 2014-03-01 DIAGNOSIS — L603 Nail dystrophy: Secondary | ICD-10-CM

## 2014-03-01 MED ORDER — TAVABOROLE 5 % EX SOLN: CUTANEOUS | Status: DC

## 2014-03-02 ENCOUNTER — Telehealth: Payer: Self-pay | Admitting: *Deleted

## 2014-03-02 ENCOUNTER — Other Ambulatory Visit: Payer: Self-pay | Admitting: Podiatry

## 2014-03-02 DIAGNOSIS — B351 Tinea unguium: Secondary | ICD-10-CM

## 2014-03-02 NOTE — Telephone Encounter (Signed)
Dr Milinda Pointer, please advise if you want labs and what type of medication, I wasn't able to determine on the last office visit note. Thanks

## 2014-03-02 NOTE — Telephone Encounter (Signed)
Pt state she was given option of a cream of pill to treat her foot, and choose the cream.  Pt states she would like to try the pill and get the labs drawn today since she is in [place] today.

## 2014-03-03 NOTE — Progress Notes (Signed)
She presents today for follow-up of her pathology from her toenail samples.  Objective: Onychomycosis was noted on pathologic evaluation. No change on physical exam.  Assessment: Onychomycosis.  Plan: We discussed the etiology pathology conservative versus surgical therapies. While in the office she had decided she would like to utilize a topical rather than an oral medication. Quin Hoop was prescribed for her. However I have recently received a message from the staff stating that she would rather have Lamisil oral. So we will start her on Lamisil 250 mg tablets 1 by mouth daily after a liver profile and CBC will be performed. I will follow-up with her in 1 month.

## 2014-03-03 NOTE — Telephone Encounter (Signed)
Lamisil 250 mg # 30 NR.  Liver profile and a CBC. FU with me in one month.  You both should know that I am not available to answer same day questions on Fridays or other surgery day.  When that is the case you probably should ask another doctor especially if it would be convenient for the patient.

## 2014-03-05 ENCOUNTER — Other Ambulatory Visit: Payer: Self-pay

## 2014-03-05 MED ORDER — TERBINAFINE HCL 250 MG PO TABS: 250.0000 mg | ORAL_TABLET | Freq: Every day | ORAL | Status: DC

## 2014-03-05 NOTE — Telephone Encounter (Signed)
[name],  Please take this for Dr. Milinda Pointer.  [name]

## 2014-03-28 ENCOUNTER — Ambulatory Visit (INDEPENDENT_AMBULATORY_CARE_PROVIDER_SITE_OTHER): Payer: Federal, State, Local not specified - PPO | Admitting: Adult Health

## 2014-03-28 ENCOUNTER — Encounter: Payer: Self-pay | Admitting: Adult Health

## 2014-03-28 VITALS — BP 110/76 | Ht 67.0 in | Wt 155.0 lb

## 2014-03-28 DIAGNOSIS — IMO0002 Reserved for concepts with insufficient information to code with codable children: Secondary | ICD-10-CM | POA: Insufficient documentation

## 2014-03-28 DIAGNOSIS — N8110 Cystocele, unspecified: Secondary | ICD-10-CM

## 2014-03-28 NOTE — Patient Instructions (Signed)

## 2014-03-28 NOTE — Progress Notes (Signed)
Subjective:     Patient ID: [name], female   DOB: [birth year], 54 y.o.   MRN: [id number]  HPI [name] is a 54 year old white female that has noticed a bugle in vagina, she has occasional urge incontinence.  Review of Systems See HPI Reviewed past medical,surgical, social and family history. Reviewed medications and allergies.     Objective:   Physical Exam BP 110/76 mmHg  Ht 5\' 7"  (1.702 m)  Wt 155 lb (70.308 kg)  BMI 24.27 kg/m2    Skin warm and dry.Pelvic: external genitalia is normal in appearance, no lesions, vagina: decrease color, moisture and rugae, mild cystocele, cervix:smooth and bulbous, uterus: normal size, shape and contour, non tender, no masses felt, adnexa: no masses or tenderness noted.  Assessment:     Cystocele     Plan:    Can do kegels, will watch for now Review handouts on cystocele and pelvic organ prolapse Follow up prn

## 2014-06-13 ENCOUNTER — Telehealth: Payer: Self-pay | Admitting: *Deleted

## 2014-06-13 NOTE — Telephone Encounter (Signed)
Pt states she could not take the oral anti-fungal medication due to her liver problems, and she would like to use the cream got her infected toe.

## 2014-06-28 ENCOUNTER — Telehealth: Payer: Self-pay | Admitting: *Deleted

## 2014-06-28 NOTE — Telephone Encounter (Addendum)
[facility] sent a refill request for Lamisil.  Prescription was denied.  Patient stated she doesn't want to take due to liver problems per Kathryne Hitch.   I faxed denial to [facility].

## 2014-07-02 ENCOUNTER — Telehealth: Payer: Self-pay | Admitting: Podiatry

## 2014-07-02 NOTE — Telephone Encounter (Signed)
Patient called to cancel her appointment for tomorrow 04/19 with Dr. Milinda Pointer for a follow up medicine check. She stated she has never taken the medicine because the cream was never called in. She stated she is unable to take the pill. She requested the cream be called in to [facility]. Their phone number is [phone].

## 2014-07-03 ENCOUNTER — Ambulatory Visit: Payer: Federal, State, Local not specified - PPO | Admitting: Podiatry

## 2014-07-03 MED ORDER — TAVABOROLE 5 % EX SOLN: 1.0000 "application " | Freq: Every day | CUTANEOUS | Status: DC

## 2014-07-03 NOTE — Telephone Encounter (Signed)
I called patient to see what medication she was requesting.  We received a refill request from your pharmacy for Lamisil.  Received a message stating you wanted a cream for your toenails.  There is not a cream prescribed to treat fungus.  "I can't take the pills.  My primary care doctor told me not to take that, I'm scared to take it.  He told me there was a cream, that's what he told me when I saw him.  He said to put it on my nail and a little on the skin below."  He mentioned to you Kerydin.  It is a liquid topical.  "Oh okay, can he prescribe that for me?"  Yes, we can.  We use a mail order company for [place].  They will verify your insurance and if approved, it will be delivered to your home.  "Okay, that is fine."

## 2014-07-06 ENCOUNTER — Telehealth: Payer: Self-pay | Admitting: *Deleted

## 2014-07-06 NOTE — Telephone Encounter (Signed)
I obtained authorization for patient's [place] from [place].  Authorization is from 05/07/2014 to 07/06/2015.  I faxed authorization notice to [facility].

## 2014-09-28 ENCOUNTER — Ambulatory Visit (INDEPENDENT_AMBULATORY_CARE_PROVIDER_SITE_OTHER): Payer: Federal, State, Local not specified - PPO | Admitting: Cardiology

## 2014-09-28 ENCOUNTER — Encounter: Payer: Self-pay | Admitting: Cardiology

## 2014-09-28 VITALS — BP 110/86 | HR 77 | Ht 67.0 in | Wt 149.0 lb

## 2014-09-28 DIAGNOSIS — I471 Supraventricular tachycardia: Secondary | ICD-10-CM | POA: Diagnosis not present

## 2014-09-28 NOTE — Patient Instructions (Signed)
Your physician recommends that you continue on your current medications as directed. Please refer to the Current Medication list given to you today. Your physician recommends that you schedule a follow-up appointment in: 2 year. You will receive a reminder letter in the mail in about 20 months reminding you to call and schedule your appointment. If you don't receive this letter, please contact our office.

## 2014-09-28 NOTE — Progress Notes (Signed)
Cardiology Office Note  Date: 09/28/2014   ID: AMITY ROES, DOB 30-Jul-1960, MRN [id number]  PCP: Deloria Lair, MD  Primary Cardiologist: Rozann Lesches, MD   Chief Complaint  Patient presents with  . PSVT    History of Present Illness: [name] is a 54 y.o. female last seen in July 2014. She presents for a routine follow-up visit. Since our last encounter, she reports only rare, very brief sense of palpitations on atenolol. No definite breakthrough SVT, certainly nothing that has required further intervention.  She continues to work as a Development worker, community carrier. Reports no limiting dyspnea on exertion, no exertional chest pain.  Follow-up ECG today is normal.   Past Medical History  Diagnosis Date  . Depression   . Hypothyroidism   . Paroxysmal supraventricular tachycardia   . Anxiety   . Hypertension   . Fibroids   . Ovarian cyst   . Rectocele   . Urinary incontinence   . Fibroadenoma of breast   . HPV test positive   . History of HPV infection   . Cystocele     Current Outpatient Prescriptions  Medication Sig Dispense Refill  . acetaminophen (TYLENOL) 500 MG tablet Take 1,000 mg by mouth as needed for pain.    Marland Kitchen atenolol (TENORMIN) 50 MG tablet TAKE 1 TABLET BY MOUTH TWICE DAILY 60 tablet 1  . buPROPion (WELLBUTRIN SR) 150 MG 12 hr tablet Take one by mouth daily      . levothyroxine (SYNTHROID, LEVOTHROID) 75 MCG tablet Take 75 mcg by mouth daily.    . ranitidine (ZANTAC) 75 MG tablet Take 75 mg by mouth as needed for heartburn.     No current facility-administered medications for this visit.    Allergies:  Codeine; Penicillins; Sulfonamide derivatives; and Ibuprofen   Social History: The patient  reports that she has been smoking Cigarettes.  She has a 15 pack-year smoking history. She has never used smokeless tobacco. She reports that she does not drink alcohol or use illicit drugs.   ROS:  Please see the history of present illness. Otherwise, complete  review of systems is positive for intermittent anxiety.  All other systems are reviewed and negative.   Physical Exam: VS:  BP 110/86 mmHg  Pulse 77  Ht 5\' 7"  (1.702 m)  Wt 149 lb (67.586 kg)  BMI 23.33 kg/m2  SpO2 98%, BMI Body mass index is 23.33 kg/(m^2).  Wt Readings from Last 3 Encounters:  09/28/14 149 lb (67.586 kg)  03/28/14 155 lb (70.308 kg)  01/30/14 150 lb (68.04 kg)     Normally nourished appearing woman in no acute distress.  HEENT: Conjunctiva and lids are normal, oropharynx with moist mucosa.  Neck: Supple, no elevated JVP or bruits.  Lungs: Clear auscultation, nonlabored.  Cardiac: Regular rate and rhythm, no S3, no significant systolic murmur.  Abdomen: Soft, nontender, bowel sounds present.  Extremities: No pitting edema, distal pulses 2+.   ECG: ECG is ordered today and shows normal sinus rhythm.   Assessment and Plan:  History of PSVT, quiescent on atenolol. ECG today is normal. Continue observation. Keep interval follow-up with Dr. Scotty Court.  Current medicines were reviewed with the patient today.   Orders Placed This Encounter  Procedures  . EKG 12-Lead    Disposition: FU with me in 2 years.   Signed, Satira Sark, MD, [facility] 09/28/2014 8:39 AM    [facility] at [place], [place], [place] [address] Phone: [phone]; Fax: [phone]

## 2015-12-09 ENCOUNTER — Telehealth: Payer: Self-pay | Admitting: *Deleted

## 2015-12-09 NOTE — Telephone Encounter (Signed)
PMD (Tapper) called patient, wanting to change her Atenolol to Metoprolol due to back order.  Patient is concerned about making this change.

## 2015-12-10 NOTE — Telephone Encounter (Signed)
This is a reasonable switch, particularly with the shortage of atenolol.

## 2015-12-10 NOTE — Telephone Encounter (Signed)
Patient notified

## 2015-12-10 NOTE — Telephone Encounter (Signed)
Mrs. [name] called the office stating that she was awaiting a telephone call from our office in regards to medication.

## 2015-12-10 NOTE — Telephone Encounter (Signed)
Left message to return call 

## 2016-04-07 ENCOUNTER — Encounter: Payer: Self-pay | Admitting: Podiatry

## 2016-04-07 ENCOUNTER — Ambulatory Visit (INDEPENDENT_AMBULATORY_CARE_PROVIDER_SITE_OTHER): Payer: Federal, State, Local not specified - PPO | Admitting: Podiatry

## 2016-04-07 DIAGNOSIS — B351 Tinea unguium: Secondary | ICD-10-CM

## 2016-04-07 LAB — CBC WITH DIFFERENTIAL/PLATELET
Basophils Absolute: 97 cells/uL (ref 0–200)
Basophils Relative: 1 %
EOS ABS: 291 {cells}/uL (ref 15–500)
Eosinophils Relative: 3 %
HEMATOCRIT: 40.3 % (ref 35.0–45.0)
Hemoglobin: 13.8 g/dL (ref 11.7–15.5)
LYMPHS PCT: 39 %
Lymphs Abs: 3783 cells/uL (ref 850–3900)
MCH: 30.9 pg (ref 27.0–33.0)
MCHC: 34.2 g/dL (ref 32.0–36.0)
MCV: 90.4 fL (ref 80.0–100.0)
MONO ABS: 679 {cells}/uL (ref 200–950)
MPV: 9.3 fL (ref 7.5–12.5)
Monocytes Relative: 7 %
NEUTROS PCT: 50 %
Neutro Abs: 4850 cells/uL (ref 1500–7800)
PLATELETS: 364 10*3/uL (ref 140–400)
RBC: 4.46 MIL/uL (ref 3.80–5.10)
RDW: 13.8 % (ref 11.0–15.0)
WBC: 9.7 10*3/uL (ref 3.8–10.8)

## 2016-04-07 LAB — HEPATIC FUNCTION PANEL
ALT: 14 U/L (ref 6–29)
AST: 15 U/L (ref 10–35)
Albumin: 4.2 g/dL (ref 3.6–5.1)
Alkaline Phosphatase: 65 U/L (ref 33–130)
Bilirubin, Direct: 0.1 mg/dL (ref <=0.2)
Indirect Bilirubin: 0.2 mg/dL (ref 0.2–1.2)
Total Bilirubin: 0.3 mg/dL (ref 0.2–1.2)
Total Protein: 6.8 g/dL (ref 6.1–8.1)

## 2016-04-07 MED ORDER — TERBINAFINE HCL 250 MG PO TABS: 250.0000 mg | ORAL_TABLET | Freq: Every day | ORAL | 0 refills | Status: DC

## 2016-04-08 NOTE — Progress Notes (Signed)
She presents today for follow-up of her onychomycosis hallux and second nails bilaterally. She states she never had the blood work done and never took the medication. He would like to try that again.  Objective: No change in physical exam toenails are thick yellow dystrophic with mycotic pulses are palpable. There is some nail dystrophy also included to the hallux nail plate away left.  Assessment: Onychomycosis.  Plan: Dispensed a prescription for Lamisil 250 mg tablets 1 by mouth daily #30 with a prescription for blood work. We requested a liver profile and CBC. I will follow-up with her in 1 month. I will notify her with questions or concerns.

## 2016-04-09 ENCOUNTER — Other Ambulatory Visit: Payer: Federal, State, Local not specified - PPO | Admitting: Adult Health

## 2016-04-10 ENCOUNTER — Telehealth: Payer: Self-pay | Admitting: *Deleted

## 2016-04-10 NOTE — Telephone Encounter (Addendum)
-----   Message from Garrel Ridgel, [place] sent at 04/08/2016  7:55 AM EST ----- Blood work looks good and may continue medication. 04/10/2016-Informed pt of Dr. Stephenie Acres orders.

## 2016-04-23 ENCOUNTER — Other Ambulatory Visit: Payer: Federal, State, Local not specified - PPO | Admitting: Adult Health

## 2016-05-05 ENCOUNTER — Ambulatory Visit: Payer: Federal, State, Local not specified - PPO | Admitting: Podiatry

## 2016-05-07 ENCOUNTER — Encounter: Payer: Self-pay | Admitting: Adult Health

## 2016-05-07 ENCOUNTER — Ambulatory Visit (INDEPENDENT_AMBULATORY_CARE_PROVIDER_SITE_OTHER): Payer: Federal, State, Local not specified - PPO | Admitting: Adult Health

## 2016-05-07 ENCOUNTER — Other Ambulatory Visit (HOSPITAL_COMMUNITY)
Admission: RE | Admit: 2016-05-07 | Discharge: 2016-05-07 | Disposition: A | Discharge status: Home / Self Care | Payer: Federal, State, Local not specified - PPO | Source: Ambulatory Visit | Attending: Adult Health | Admitting: Adult Health

## 2016-05-07 VITALS — BP 110/70 | HR 70 | Ht 66.25 in | Wt 154.0 lb

## 2016-05-07 DIAGNOSIS — N814 Uterovaginal prolapse, unspecified: Secondary | ICD-10-CM

## 2016-05-07 DIAGNOSIS — Z01411 Encounter for gynecological examination (general) (routine) with abnormal findings: Secondary | ICD-10-CM

## 2016-05-07 DIAGNOSIS — Z1211 Encounter for screening for malignant neoplasm of colon: Secondary | ICD-10-CM

## 2016-05-07 DIAGNOSIS — Z1151 Encounter for screening for human papillomavirus (HPV): Secondary | ICD-10-CM | POA: Insufficient documentation

## 2016-05-07 DIAGNOSIS — Z01419 Encounter for gynecological examination (general) (routine) without abnormal findings: Secondary | ICD-10-CM | POA: Diagnosis present

## 2016-05-07 DIAGNOSIS — Z8619 Personal history of other infectious and parasitic diseases: Secondary | ICD-10-CM

## 2016-05-07 DIAGNOSIS — Z1212 Encounter for screening for malignant neoplasm of rectum: Secondary | ICD-10-CM

## 2016-05-07 LAB — HEMOCCULT GUIAC POC 1CARD (OFFICE): FECAL OCCULT BLD: NEGATIVE

## 2016-05-07 NOTE — Patient Instructions (Signed)
Physical in 1 year Pap in 3 if normal Mammogram now and yearly Colonoscopy advised

## 2016-05-07 NOTE — Progress Notes (Signed)
Patient ID: [name], female   DOB: Sep 02, 1960, 56 y.o.   MRN: [id number] History of Present Illness:  [name] is a 56 year old white female in for well woman gyn exam and pap.She thinks bladder has dropped.She had normal pap with +HPV 09/11/13 but was not 55/18  PCP is [name].   Current Medications, Allergies, Past Medical History, Past Surgical History, Family History and Social History were reviewed in [employer] record.     Review of Systems: Patient denies any headaches, hearing loss, fatigue, blurred vision, shortness of breath, chest pain, abdominal pain, problems with bowel movements, urination, or intercourse(not having sex). No joint pain or mood swings.?bladder dropped.     Physical Exam:BP 110/70 (BP Location: Left Arm, Patient Position: Sitting, Cuff Size: Normal)   Pulse 70   Ht 5' 6.25" (1.683 m)   Wt 154 lb (69.9 kg)   BMI 24.67 kg/m  General:  Well developed, well nourished, no acute distress Skin:  Warm and dry Neck:  Midline trachea, normal thyroid, good ROM, no lymphadenopathy Lungs; Clear to auscultation bilaterally Breast:  No dominant palpable mass, retraction, or nipple discharge Cardiovascular: Regular rate and rhythm Abdomen:  Soft, non tender, no hepatosplenomegaly Pelvic:  External genitalia is normal in appearance, no lesions.  The vagina is normal in appearance. Urethra has no lesions or masses.+cystocele. The cervix is bulbous. Pap with HPV performed. Uterus is felt to be normal size, shape, and contour, with uterine descensus  No adnexal masses or tenderness noted.Bladder is non tender, no masses felt. Rectal: Good sphincter tone, no polyps, or hemorrhoids felt.  Hemoccult negative.+rectocele Extremities/musculoskeletal:  No swelling or varicosities noted, no clubbing or cyanosis Psych:  No mood changes, alert and cooperative,seems happy PHQ 2 score 0.Discussed trying pessary and she wants to.  Impression: 1. Encounter for  gynecological examination with Papanicolaou smear of cervix   2. History of HPV infection   3. Cystocele with small rectocele and uterine descent   4. Screening for colorectal cancer       Plan: Return in 3 weeks for pessary fitting with [name] Review medical explainer #4 on pelvic relaxation  Physical in 1 year Pap in 3 if normal Mammogram now and yearly Colonoscopy advised  Labs with PCP

## 2016-05-11 LAB — CYTOLOGY - PAP
Diagnosis: NEGATIVE
HPV (WINDOPATH): NOT DETECTED

## 2016-05-14 ENCOUNTER — Ambulatory Visit: Payer: Federal, State, Local not specified - PPO | Admitting: Podiatry

## 2016-05-28 ENCOUNTER — Ambulatory Visit (INDEPENDENT_AMBULATORY_CARE_PROVIDER_SITE_OTHER): Payer: Federal, State, Local not specified - PPO | Admitting: Obstetrics and Gynecology

## 2016-05-28 ENCOUNTER — Encounter: Payer: Self-pay | Admitting: Obstetrics and Gynecology

## 2016-05-28 ENCOUNTER — Ambulatory Visit (INDEPENDENT_AMBULATORY_CARE_PROVIDER_SITE_OTHER): Payer: Federal, State, Local not specified - PPO | Admitting: Podiatry

## 2016-05-28 VITALS — BP 108/74 | HR 73 | Wt 153.6 lb

## 2016-05-28 DIAGNOSIS — Z79899 Other long term (current) drug therapy: Secondary | ICD-10-CM

## 2016-05-28 DIAGNOSIS — B351 Tinea unguium: Secondary | ICD-10-CM | POA: Diagnosis not present

## 2016-05-28 DIAGNOSIS — N3941 Urge incontinence: Secondary | ICD-10-CM

## 2016-05-28 DIAGNOSIS — N814 Uterovaginal prolapse, unspecified: Secondary | ICD-10-CM | POA: Diagnosis not present

## 2016-05-28 DIAGNOSIS — N8110 Cystocele, unspecified: Secondary | ICD-10-CM

## 2016-05-28 MED ORDER — TERBINAFINE HCL 250 MG PO TABS: 250.0000 mg | ORAL_TABLET | Freq: Every day | ORAL | 0 refills | Status: DC

## 2016-05-28 NOTE — Progress Notes (Signed)
[facility] Visit  @DATE @            Patient name: [name] MRN [id number]  Date of birth: Apr 09, 1960  CC & HPI:  [name] is a 56 y.o. female presenting today for a pessary fitting. She was seen by Anderson Malta on 05/07/16 and was diagnosed with cystocele and small rectocele with uterine descent. She denies voiding concerns aside from occasional urge incontinence.   ROS:  ROS No constipation +urge incontinence   Pertinent History Reviewed:   Reviewed: Significant for cystocele, rectocele  Medical         Past Medical History:  Diagnosis Date  . Anxiety   . Cystocele   . Depression   . Fibroadenoma of breast   . Fibroids   . History of HPV infection   . HPV test positive   . Hypertension    during 3rd pregnancy  . Hypothyroidism   . Ovarian cyst   . Paroxysmal supraventricular tachycardia ([place])   . Rectocele   . Urinary incontinence                               Surgical Hx:    Past Surgical History:  Procedure Laterality Date  . BOIL REMOVAL  1996   From leg   Medications: Reviewed & Updated - see associated section                       Current Outpatient Prescriptions:  .  acetaminophen (TYLENOL) 500 MG tablet, Take 1,000 mg by mouth as needed for pain., Disp: , Rfl:  .  buPROPion (WELLBUTRIN SR) 150 MG 12 hr tablet, Take one by mouth daily  , Disp: , Rfl:  .  levothyroxine (SYNTHROID, LEVOTHROID) 75 MCG tablet, Take 75 mcg by mouth daily., Disp: , Rfl:  .  metoprolol (LOPRESSOR) 50 MG tablet, Take 50 mg by mouth 2 (two) times daily. , Disp: , Rfl:  .  ranitidine (ZANTAC) 75 MG tablet, Take 75 mg by mouth as needed for heartburn., Disp: , Rfl:  .  LORazepam (ATIVAN) 0.5 MG tablet, Take 0.5 mg by mouth as needed. , Disp: , Rfl:  .  terbinafine (LAMISIL) 250 MG tablet, Take 1 tablet (250 mg total) by mouth daily. (Patient not taking: Reported on 05/28/2016), Disp: 30 tablet, Rfl: 0   Social History: Reviewed -  reports that she has been smoking  Cigarettes.  She has a 30.00 pack-year smoking history. She has never used smokeless tobacco.  Objective Findings:  Vitals: Blood pressure 108/74, pulse 73, weight 153 lb 9.6 oz (69.7 kg).  Physical Examination: General appearance - alert, well appearing, and in no distress Mental status - alert, oriented to person, place, and time Pelvic - VULVA: normal appearing vulva with no masses, tenderness or lesions,  VAGINA: Grade 2 cystocele almost at introitus   CERVIX: normal appearing cervix without discharge or lesions,  UTERUS: uterus is normal size, shape, consistency and nontender,  ADNEXA: normal adnexa in size, nontender and no masses RECTAL: normal rectal tone   Assessment & Plan:   A:  1. Cystocele, 2nd degree uterine descensus   P:  1. Fitted with # 3 2.50" ring pessary with support    By signing my name below, I, Sonum Patel, attest that this documentation has been prepared under the direction and in the presence of Jonnie Kind, MD. Electronically Signed:  Sonum Patel, Scribe. 05/28/16. 11:18 AM.  I personally performed the services described in this documentation, which was SCRIBED in my presence. The recorded information has been reviewed and considered accurate. It has been edited as necessary during review. Jonnie Kind, MD

## 2016-05-28 NOTE — Progress Notes (Signed)
She presents today after taking the Lamisil for 1 month. She denies fever chills nausea vomiting muscle aches and pains states that she's doing just great with the Lamisil.  Objective: Vital signs are stable she's alert and oriented 3 no change in physical exam.  Assessment: Onychomycosis being treated with long-term therapy.  Plan: Request another liver profile today and started her on 90 days of Lamisil. Follow up with her in 4 months.

## 2016-06-11 ENCOUNTER — Encounter: Payer: Federal, State, Local not specified - PPO | Admitting: Obstetrics and Gynecology

## 2016-09-17 ENCOUNTER — Encounter (INDEPENDENT_AMBULATORY_CARE_PROVIDER_SITE_OTHER): Payer: Federal, State, Local not specified - PPO | Admitting: Podiatry

## 2016-09-17 NOTE — Progress Notes (Signed)
This encounter was created in error - please disregard.

## 2016-09-25 ENCOUNTER — Telehealth: Payer: Self-pay | Admitting: *Deleted

## 2016-09-25 NOTE — Telephone Encounter (Signed)
Pt states she has completed her pills for fungus and wanted to know if she needed more. I told pt that she was prescribed #90 Lamisil 05/28/2016 and if she had completed the medication she had taken the therapeutic amount, and would need to be evaluated by Dr. Milinda Pointer in 30 days. Pt states she has an appt on 10/13/2016 with Dr. Milinda Pointer.

## 2016-10-13 ENCOUNTER — Ambulatory Visit: Payer: Federal, State, Local not specified - PPO | Admitting: Podiatry

## 2016-12-01 LAB — BASIC METABOLIC PANEL: Glucose: 112

## 2017-05-10 DIAGNOSIS — I482 Chronic atrial fibrillation, unspecified: Secondary | ICD-10-CM | POA: Insufficient documentation

## 2017-05-17 ENCOUNTER — Ambulatory Visit (INDEPENDENT_AMBULATORY_CARE_PROVIDER_SITE_OTHER): Payer: Federal, State, Local not specified - PPO | Admitting: Otolaryngology

## 2017-05-17 DIAGNOSIS — K1122 Acute recurrent sialoadenitis: Secondary | ICD-10-CM | POA: Diagnosis not present

## 2018-02-07 NOTE — Progress Notes (Deleted)
Cardiology Office Note  Date: 02/07/2018   ID: [name], DOB [birth year], MRN [id number]  PCP: Deloria Lair., MD  Primary Cardiologist: Rozann Lesches, MD   No chief complaint on file.   History of Present Illness: [name] is a 57 y.o. female last seen in July 2016.  Past Medical History:  Diagnosis Date  . Anxiety   . Cystocele   . Depression   . Fibroadenoma of breast   . Fibroids   . History of HPV infection   . HPV test positive   . Hypertension    during 3rd pregnancy  . Hypothyroidism   . Ovarian cyst   . Paroxysmal supraventricular tachycardia ([place])   . Rectocele   . Urinary incontinence     Past Surgical History:  Procedure Laterality Date  . BOIL REMOVAL  1996   From leg    Current Outpatient Medications  Medication Sig Dispense Refill  . acetaminophen (TYLENOL) 500 MG tablet Take 1,000 mg by mouth as needed for pain.    Marland Kitchen buPROPion (WELLBUTRIN SR) 150 MG 12 hr tablet Take one by mouth daily      . levothyroxine (SYNTHROID, LEVOTHROID) 75 MCG tablet Take 75 mcg by mouth daily.    Marland Kitchen LORazepam (ATIVAN) 0.5 MG tablet Take 0.5 mg by mouth as needed.     . metoprolol (LOPRESSOR) 50 MG tablet Take 50 mg by mouth 2 (two) times daily.     . ranitidine (ZANTAC) 75 MG tablet Take 75 mg by mouth as needed for heartburn.    . terbinafine (LAMISIL) 250 MG tablet Take 1 tablet (250 mg total) by mouth daily. 90 tablet 0   No current facility-administered medications for this visit.    Allergies:  Codeine; Penicillins; Sulfa antibiotics; and Ibuprofen   Social History: The patient  reports that she has been smoking cigarettes. She has a 30.00 pack-year smoking history. She has never used smokeless tobacco. She reports that she does not drink alcohol or use drugs.   Family History: The patient's family history includes Breast cancer in her mother; COPD in her sister; Cancer in her mother; Heart disease in her brother; Hypertension in her maternal  grandfather; Lung cancer in her mother; Thyroid disease in her maternal grandmother.   ROS:  Please see the history of present illness. Otherwise, complete review of systems is positive for {NONE DEFAULTED:18576::"none"}.  All other systems are reviewed and negative.   Physical Exam: VS:  There were no vitals taken for this visit., BMI There is no height or weight on file to calculate BMI.  Wt Readings from Last 3 Encounters:  05/28/16 153 lb 9.6 oz (69.7 kg)  05/07/16 154 lb (69.9 kg)  09/28/14 149 lb (67.6 kg)    General: Patient appears comfortable at rest. HEENT: Conjunctiva and lids normal, oropharynx clear with moist mucosa. Neck: Supple, no elevated JVP or carotid bruits, no thyromegaly. Lungs: Clear to auscultation, nonlabored breathing at rest. Cardiac: Regular rate and rhythm, no S3 or significant systolic murmur, no pericardial rub. Abdomen: Soft, nontender, no hepatomegaly, bowel sounds present, no guarding or rebound. Extremities: No pitting edema, distal pulses 2+. Skin: Warm and dry. Musculoskeletal: No kyphosis. Neuropsychiatric: Alert and oriented x3, affect grossly appropriate.  ECG: I personally reviewed the tracing from 09/28/2014 which showed normal sinus rhythm.  Recent Labwork:  January 2018: Hemoglobin 13.8, platelets 364, AST 15, ALT 14  Other Studies Reviewed Today:   Assessment and Plan:    Current  medicines were reviewed with the patient today.  No orders of the defined types were placed in this encounter.   Disposition:  Signed, Satira Sark, MD, [facility] 02/07/2018 1:01 PM    [facility] at [place], [place], [place] [address] Phone: [phone]; Fax: [phone]

## 2018-02-08 ENCOUNTER — Ambulatory Visit: Payer: Federal, State, Local not specified - PPO | Admitting: Cardiology

## 2018-03-25 ENCOUNTER — Encounter: Payer: Self-pay | Admitting: Cardiology

## 2018-03-25 ENCOUNTER — Ambulatory Visit (INDEPENDENT_AMBULATORY_CARE_PROVIDER_SITE_OTHER): Payer: Federal, State, Local not specified - PPO | Admitting: Cardiology

## 2018-03-25 VITALS — BP 122/80 | HR 67 | Ht 66.5 in | Wt 150.0 lb

## 2018-03-25 DIAGNOSIS — Z8679 Personal history of other diseases of the circulatory system: Secondary | ICD-10-CM

## 2018-03-25 DIAGNOSIS — I471 Supraventricular tachycardia: Secondary | ICD-10-CM

## 2018-03-25 NOTE — Progress Notes (Signed)
Cardiology Office Note  Date: 03/25/2018   ID: [name], DOB [birth year], MRN [id number]  PCP: Patient, No Pcp Per  Primary Cardiologist: Rozann Lesches, MD   Chief Complaint  Patient presents with  . PSVT    History of Present Illness: [name] is a 58 y.o. female last seen in the office in July 2016.  She presents overdue for follow-up.  Still doing well in terms of PSVT control on Lopressor.  She has only brief episodes, no chest pain or syncope.  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  She does not have a primary care provider at this time, states that she is looking.  She previously saw Dr. Scotty Court.  I personally reviewed her ECG today which shows sinus rhythm with nonspecific T wave changes.  Past Medical History:  Diagnosis Date  . Anxiety   . Cystocele   . Depression   . Fibroadenoma of breast   . Fibroids   . History of HPV infection   . HPV test positive   . Hypertension    during 3rd pregnancy  . Hypothyroidism   . Ovarian cyst   . Paroxysmal supraventricular tachycardia ([place])   . Rectocele   . Urinary incontinence     Past Surgical History:  Procedure Laterality Date  . BOIL REMOVAL  1996   From leg    Current Outpatient Medications  Medication Sig Dispense Refill  . acetaminophen (TYLENOL) 500 MG tablet Take 1,000 mg by mouth as needed for pain.    Marland Kitchen buPROPion (WELLBUTRIN SR) 150 MG 12 hr tablet Take one by mouth daily      . levothyroxine (SYNTHROID, LEVOTHROID) 88 MCG tablet Take 88 mcg by mouth daily before breakfast.    . metoprolol (LOPRESSOR) 50 MG tablet Take 50 mg by mouth 2 (two) times daily.      No current facility-administered medications for this visit.    Allergies:  Codeine; Penicillins; Sulfa antibiotics; and Ibuprofen   Social History: The patient  reports that she has been smoking cigarettes. She has a 30.00 pack-year smoking history. She has never used smokeless tobacco. She reports that she  does not drink alcohol or use drugs.   ROS:  Please see the history of present illness. Otherwise, complete review of systems is positive for none.  All other systems are reviewed and negative.   Physical Exam: VS:  BP 122/80   Pulse 67   Ht 5' 6.5" (1.689 m)   Wt 150 lb (68 kg)   SpO2 99%   BMI 23.85 kg/m , BMI Body mass index is 23.85 kg/m.  Wt Readings from Last 3 Encounters:  03/25/18 150 lb (68 kg)  05/28/16 153 lb 9.6 oz (69.7 kg)  05/07/16 154 lb (69.9 kg)    General: Patient appears comfortable at rest. HEENT: Conjunctiva and lids normal, oropharynx clear. Neck: Supple, no elevated JVP or carotid bruits, no thyromegaly. Lungs: Clear to auscultation, nonlabored breathing at rest. Cardiac: Regular rate and rhythm, no S3 or significant systolic murmur, no pericardial rub. Abdomen: Soft, nontender, bowel sounds present. Extremities: No pitting edema, distal pulses 2+.  ECG: I personally reviewed the tracing from 10/12/2012 which showed normal sinus rhythm.  Recent Labwork:  February 2019: TSH 6.77, free T4 1.33  Assessment and Plan:  1.  PSVT, well controlled on Lopressor.  I reviewed her follow-up ECG.  No changes made today.  We will schedule an annual follow-up.  2.  History of hypertension with pregnancy, blood pressure is normal today.  3.  Healthcare maintenance, she plans to establish with a new PCP.  Current medicines were reviewed with the patient today.   Orders Placed This Encounter  Procedures  . EKG 12-Lead    Disposition: Follow-up in 1 year.  Signed, Satira Sark, MD, [facility] 03/25/2018 3:08 PM    [facility] at [place], [place], [place] [address] Phone: [phone]; Fax: [phone]

## 2018-03-25 NOTE — Patient Instructions (Addendum)

## 2018-05-16 ENCOUNTER — Other Ambulatory Visit: Payer: Self-pay | Admitting: Cardiology

## 2019-05-01 ENCOUNTER — Other Ambulatory Visit: Payer: Self-pay | Admitting: Cardiology

## 2019-05-30 ENCOUNTER — Other Ambulatory Visit: Payer: Self-pay

## 2019-05-30 ENCOUNTER — Encounter: Payer: Self-pay | Admitting: Cardiology

## 2019-05-30 ENCOUNTER — Ambulatory Visit (INDEPENDENT_AMBULATORY_CARE_PROVIDER_SITE_OTHER): Payer: Federal, State, Local not specified - PPO | Admitting: Cardiology

## 2019-05-30 VITALS — BP 140/88 | HR 76 | Ht 66.0 in | Wt 156.0 lb

## 2019-05-30 DIAGNOSIS — I471 Supraventricular tachycardia: Secondary | ICD-10-CM | POA: Diagnosis not present

## 2019-05-30 NOTE — Progress Notes (Signed)
Cardiology Office Note  Date: 05/30/2019   ID: [name], DOB June 05, 1960, MRN [id number]  PCP:  Leeanne Rio, MD  Cardiologist:  Rozann Lesches, MD Electrophysiologist:  None   Chief Complaint  Patient presents with  . Cardiac follow-up    History of Present Illness: [name] is a 59 y.o. female last seen in January 2020.  She is here for a routine follow-up visit.  She tells me that she has had only brief episodes of palpitations that are usually gone within a few seconds.  She continues on Lopressor.  She has not had any hospital evaluation for rapid heart rate.  I personally reviewed her ECG today which shows normal sinus rhythm with nonspecific T wave changes.  Past Medical History:  Diagnosis Date  . Anxiety   . Cystocele   . Depression   . Fibroadenoma of breast   . Fibroids   . History of HPV infection   . History of hypertension    During 3rd pregnancy  . Hypothyroidism   . Ovarian cyst   . Paroxysmal supraventricular tachycardia ([place])   . Rectocele   . Urinary incontinence     Past Surgical History:  Procedure Laterality Date  . BOIL REMOVAL  1996   From leg    Current Outpatient Medications  Medication Sig Dispense Refill  . acetaminophen (TYLENOL) 500 MG tablet Take 1,000 mg by mouth as needed for pain.    Marland Kitchen buPROPion (WELLBUTRIN SR) 150 MG 12 hr tablet Take one by mouth daily      . levothyroxine (SYNTHROID, LEVOTHROID) 88 MCG tablet Take 88 mcg by mouth daily before breakfast.    . metoprolol tartrate (LOPRESSOR) 50 MG tablet TAKE 1 TABLET BY MOUTH TWICE DAILY FOR HEART 180 tablet 1   No current facility-administered medications for this visit.   Allergies:  Codeine, Penicillins, Sulfa antibiotics, and Ibuprofen   ROS:   No syncope.  Physical Exam: VS:  BP 140/88   Pulse 76   Ht 5\' 6"  (1.676 m)   Wt 156 lb (70.8 kg)   SpO2 98%   BMI 25.18 kg/m , BMI Body mass index is 25.18 kg/m.  Wt Readings from Last 3 Encounters:    05/30/19 156 lb (70.8 kg)  03/25/18 150 lb (68 kg)  05/28/16 153 lb 9.6 oz (69.7 kg)    General: Patient appears comfortable at rest. HEENT: Conjunctiva and lids normal, wearing a mask. Neck: Supple, no elevated JVP or carotid bruits, no thyromegaly. Lungs: Clear to auscultation, nonlabored breathing at rest. Cardiac: Regular rate and rhythm, no S3 or significant systolic murmur. Abdomen: Soft, nontender, bowel sounds present. Extremities: No pitting edema, distal pulses 2+.  ECG:  An ECG dated 03/25/2018 was personally reviewed today and demonstrated:  Sinus rhythm with nonspecific T wave changes.  Recent Labwork:  June 2020: Hemoglobin 14.2, platelets 385, BUN 14, creatinine 0.73, potassium 4.8, AST 16, ALT 14, cholesterol 120, triglycerides 54, HDL 45, LDL 64, TSH 1.75  Other Studies Reviewed Today:  No interval cardiac testing for review today.  Assessment and Plan:  History of PSVT.  She continues to do well on Lopressor, has not had any prolonged events or hospital evaluations.  I reviewed her ECG which is stable.  Continue with annual follow-up.  Medication Adjustments/Labs and Tests Ordered: Current medicines are reviewed at length with the patient today.  Concerns regarding medicines are outlined above.   Tests Ordered: Orders Placed This Encounter  Procedures  .  EKG 12-Lead    Medication Changes: No orders of the defined types were placed in this encounter.   Disposition:  Follow up 1 year in the [place] office.  Signed, Satira Sark, MD, [facility] 05/30/2019 4:37 PM    [facility] at [place], [place], [place] [address] Phone: [phone]; Fax: [phone]

## 2019-05-30 NOTE — Patient Instructions (Addendum)

## 2019-10-26 ENCOUNTER — Other Ambulatory Visit: Payer: Self-pay | Admitting: Cardiology

## 2020-04-23 ENCOUNTER — Other Ambulatory Visit: Payer: Self-pay | Admitting: Cardiology

## 2020-06-04 ENCOUNTER — Encounter: Payer: Self-pay | Admitting: Cardiology

## 2020-06-04 ENCOUNTER — Ambulatory Visit (INDEPENDENT_AMBULATORY_CARE_PROVIDER_SITE_OTHER): Payer: Federal, State, Local not specified - PPO | Admitting: Cardiology

## 2020-06-04 VITALS — BP 110/58 | HR 76 | Wt 157.0 lb

## 2020-06-04 DIAGNOSIS — I471 Supraventricular tachycardia: Secondary | ICD-10-CM | POA: Diagnosis not present

## 2020-06-04 NOTE — Progress Notes (Signed)
Cardiology Office Note  Date: 06/04/2020   ID: Mclean, Tara [birth year], MRN [id number]  PCP:  Tara Rio, MD  Cardiologist:  Tara Lesches, MD Electrophysiologist:  None   Chief Complaint  Patient presents with  . Cardiac follow-up    History of Present Illness: [name] is a 60 y.o. female last seen in March 2021.  She presents for a routine follow-up visit.  Only a rare sense of palpitations, no prolonged events.  She has done well on current dose of Lopressor.  She continues to follow with Dr. Huel Cote, I reviewed her lab work from July 2021 as noted below.  I personally reviewed her ECG today which shows sinus rhythm with single PAC.  Past Medical History:  Diagnosis Date  . Anxiety   . Cystocele   . Depression   . Fibroadenoma of breast   . Fibroids   . History of HPV infection   . History of hypertension    During 3rd pregnancy  . Hypothyroidism   . Ovarian cyst   . Paroxysmal supraventricular tachycardia ([place])   . Rectocele   . Urinary incontinence     Past Surgical History:  Procedure Laterality Date  . BOIL REMOVAL  1996   From leg    Current Outpatient Medications  Medication Sig Dispense Refill  . acetaminophen (TYLENOL) 500 MG tablet Take 1,000 mg by mouth as needed for pain.    Marland Kitchen buPROPion (WELLBUTRIN SR) 150 MG 12 hr tablet Take one by mouth daily    . levothyroxine (SYNTHROID, LEVOTHROID) 88 MCG tablet Take 88 mcg by mouth daily before breakfast.    . metoprolol tartrate (LOPRESSOR) 50 MG tablet TAKE 1 TABLET BY MOUTH TWICE DAILY FOR HEART 60 tablet 1   No current facility-administered medications for this visit.   Allergies:  Codeine, Erythromycin, Penicillins, Sulfa antibiotics, and Ibuprofen   ROS:  No syncope.  Physical Exam: VS:  BP (!) 110/58 (BP Location: Right Arm, Patient Position: Sitting, Cuff Size: Normal)   Pulse 76   Wt 157 lb (71.2 kg)   SpO2 98%   BMI 25.34 kg/m , BMI Body mass index is 25.34  kg/m.  Wt Readings from Last 3 Encounters:  06/04/20 157 lb (71.2 kg)  05/30/19 156 lb (70.8 kg)  03/25/18 150 lb (68 kg)    General: Patient appears comfortable at rest. HEENT: Conjunctiva and lids normal, wearing a mask. Neck: Supple, no elevated JVP or carotid bruits, no thyromegaly. Lungs: Clear to auscultation, nonlabored breathing at rest. Cardiac: Regular rate and rhythm, no S3 or significant systolic murmur. Extremities: No pitting edema.  ECG:  An ECG dated 05/30/2019 was personally reviewed today and demonstrated:  Sinus rhythm with nonspecific T wave changes.  Recent Labwork:  July 2021: Hemoglobin 14.4, platelets 391, BUN 12, creatinine 0.73, potassium 4.1, AST 15, ALT 15, cholesterol 115, triglycerides 177, HDL 37, LDL 49, TSH 2.92  Other Studies Reviewed Today:  No interval cardiac testing for review.  Assessment and Plan:  PSVT, well controlled on Lopressor with no obvious breakthrough events.  ECG reviewed.  No changes to current regimen.  Medication Adjustments/Labs and Tests Ordered: Current medicines are reviewed at length with the patient today.  Concerns regarding medicines are outlined above.   Tests Ordered: Orders Placed This Encounter  Procedures  . EKG 12-Lead    Medication Changes: No orders of the defined types were placed in this encounter.   Disposition:  Follow up 1 year in  the [place] office.  Signed, Tara Sark, MD, [facility] 06/04/2020 2:38 PM    [facility] [place], [place], [place] [address] Phone: [phone]; Fax: [phone]

## 2020-06-04 NOTE — Patient Instructions (Signed)

## 2020-06-24 ENCOUNTER — Other Ambulatory Visit: Payer: Self-pay | Admitting: Cardiology

## 2020-12-19 ENCOUNTER — Other Ambulatory Visit: Payer: Self-pay | Admitting: Cardiology

## 2021-06-19 ENCOUNTER — Other Ambulatory Visit: Payer: Self-pay | Admitting: Cardiology

## 2021-07-16 ENCOUNTER — Ambulatory Visit: Payer: Federal, State, Local not specified - PPO | Admitting: Cardiology

## 2021-07-16 ENCOUNTER — Encounter: Payer: Self-pay | Admitting: Cardiology

## 2021-07-16 VITALS — BP 142/80 | HR 68 | Ht 66.5 in | Wt 152.8 lb

## 2021-07-16 DIAGNOSIS — I471 Supraventricular tachycardia: Secondary | ICD-10-CM

## 2021-07-16 NOTE — Patient Instructions (Signed)

## 2021-07-16 NOTE — Progress Notes (Signed)
? ? ?Cardiology Office Note ? ?Date: 07/16/2021  ? ?ID: [name], DOB February 04, 1961, MRN [id number] ? ?PCP:  Leeanne Rio, MD  ?Cardiologist:  Rozann Lesches, MD ?Electrophysiologist:  None  ? ?Chief Complaint  ?Patient presents with  ? Cardiac follow-up  ? ? ?History of Present Illness: ?[name] is a 61 y.o. female last seen in March 2022.  She is here for a routine visit.  Still working for the [employer], this is her 33rd year.  She does not report any progressive palpitations and continues on Lopressor. ? ?I personally reviewed her ECG today which shows sinus rhythm with nonspecific ST-T changes. ? ?Past Medical History:  ?Diagnosis Date  ? Anxiety   ? Cystocele   ? Depression   ? Fibroadenoma of breast   ? Fibroids   ? History of HPV infection   ? History of hypertension   ? During 3rd pregnancy  ? Hypothyroidism   ? Ovarian cyst   ? Paroxysmal supraventricular tachycardia ([place])   ? Rectocele   ? Urinary incontinence   ? ? ?Past Surgical History:  ?Procedure Laterality Date  ? BOIL REMOVAL  1996  ? From leg  ? ? ?Current Outpatient Medications  ?Medication Sig Dispense Refill  ? acetaminophen (TYLENOL) 500 MG tablet Take 1,000 mg by mouth as needed for pain.    ? buPROPion (WELLBUTRIN SR) 150 MG 12 hr tablet Take one by mouth daily    ? levothyroxine (SYNTHROID, LEVOTHROID) 88 MCG tablet Take 88 mcg by mouth daily before breakfast.    ? metoprolol tartrate (LOPRESSOR) 50 MG tablet Take 1 tablet (50 mg total) by mouth 2 (two) times daily. DUE FOR VISIT 60 tablet 0  ? ?No current facility-administered medications for this visit.  ? ?Allergies:  Codeine, Erythromycin, Penicillins, Sulfa antibiotics, and Ibuprofen  ? ?ROS: No orthopnea or PND.  No chest pain. ? ?Physical Exam: ?VS:  BP (!) 142/80   Pulse 68   Ht 5' 6.5" (1.689 m)   Wt 152 lb 12.8 oz (69.3 kg)   SpO2 98%   BMI 24.29 kg/m? , BMI Body mass index is 24.29 kg/m?. ? ?Wt Readings from Last 3 Encounters:  ?07/16/21 152 lb 12.8 oz (69.3 kg)   ?06/04/20 157 lb (71.2 kg)  ?05/30/19 156 lb (70.8 kg)  ?  ?General: Patient appears comfortable at rest. ?HEENT: Conjunctiva and lids normal. ?Neck: Supple, no elevated JVP or carotid bruits, no thyromegaly. ?Lungs: Clear to auscultation, nonlabored breathing at rest. ?Cardiac: Regular rate and rhythm, no S3 or significant systolic murmur, no pericardial rub. ?Extremities: No pitting edema. ? ?ECG:  An ECG dated 06/04/2020 was personally reviewed today and demonstrated:  Sinus rhythm with PAC. ? ?Recent Labwork: ? ?August 2022: Hemoglobin 14.0, platelets 378, BUN 10, creatinine 0.74, potassium 4.8, AST 13, ALT 13, hemoglobin A1c 5.8%, cholesterol 108, triglycerides 123, HDL 37, LDL 49, TSH 2.58 ? ?Other Studies Reviewed Today: ? ?No interval cardiac testing for review today. ? ?Assessment and Plan: ? ?History of PSVT, no progressive palpitations to suggest significant breakthrough events.  I reviewed her ECG today.  Continue observation on Lopressor. ? ?Medication Adjustments/Labs and Tests Ordered: ?Current medicines are reviewed at length with the patient today.  Concerns regarding medicines are outlined above.  ? ?Tests Ordered: ?Orders Placed This Encounter  ?Procedures  ? EKG 12-Lead  ? ? ?Medication Changes: ?No orders of the defined types were placed in this encounter. ? ? ?Disposition:  Follow up  1 year. ? ?Signed, ?Satira Sark, MD, [facility] ?07/16/2021 3:51 PM    ?[facility] ?[place], [place], [place] [address] ?Phone: [phone]; Fax: [phone]  ?

## 2021-07-17 ENCOUNTER — Other Ambulatory Visit: Payer: Self-pay | Admitting: Cardiology

## 2021-10-09 ENCOUNTER — Other Ambulatory Visit: Payer: Self-pay | Admitting: Cardiology

## 2022-01-28 DIAGNOSIS — Z720 Tobacco use: Secondary | ICD-10-CM | POA: Diagnosis not present

## 2022-01-28 DIAGNOSIS — Z0000 Encounter for general adult medical examination without abnormal findings: Secondary | ICD-10-CM | POA: Diagnosis not present

## 2022-01-28 DIAGNOSIS — E039 Hypothyroidism, unspecified: Secondary | ICD-10-CM | POA: Diagnosis not present

## 2022-01-28 DIAGNOSIS — Z13820 Encounter for screening for osteoporosis: Secondary | ICD-10-CM | POA: Diagnosis not present

## 2022-01-28 DIAGNOSIS — F411 Generalized anxiety disorder: Secondary | ICD-10-CM | POA: Diagnosis not present

## 2022-01-28 DIAGNOSIS — R7303 Prediabetes: Secondary | ICD-10-CM | POA: Diagnosis not present

## 2022-01-28 DIAGNOSIS — E785 Hyperlipidemia, unspecified: Secondary | ICD-10-CM | POA: Insufficient documentation

## 2022-02-12 DIAGNOSIS — Z1231 Encounter for screening mammogram for malignant neoplasm of breast: Secondary | ICD-10-CM | POA: Diagnosis not present

## 2022-02-12 DIAGNOSIS — E039 Hypothyroidism, unspecified: Secondary | ICD-10-CM | POA: Diagnosis not present

## 2022-03-19 DIAGNOSIS — K5900 Constipation, unspecified: Secondary | ICD-10-CM | POA: Diagnosis not present

## 2022-05-07 ENCOUNTER — Other Ambulatory Visit: Payer: Self-pay | Admitting: Cardiology

## 2022-05-14 DIAGNOSIS — Q828 Other specified congenital malformations of skin: Secondary | ICD-10-CM | POA: Diagnosis not present

## 2022-05-14 DIAGNOSIS — L110 Acquired keratosis follicularis: Secondary | ICD-10-CM | POA: Diagnosis not present

## 2022-05-14 DIAGNOSIS — M79671 Pain in right foot: Secondary | ICD-10-CM | POA: Diagnosis not present

## 2022-05-14 DIAGNOSIS — M79674 Pain in right toe(s): Secondary | ICD-10-CM | POA: Diagnosis not present

## 2022-06-25 DIAGNOSIS — Q828 Other specified congenital malformations of skin: Secondary | ICD-10-CM | POA: Diagnosis not present

## 2022-06-25 DIAGNOSIS — M2011 Hallux valgus (acquired), right foot: Secondary | ICD-10-CM | POA: Diagnosis not present

## 2022-06-25 DIAGNOSIS — M79674 Pain in right toe(s): Secondary | ICD-10-CM | POA: Diagnosis not present

## 2022-06-25 DIAGNOSIS — M79671 Pain in right foot: Secondary | ICD-10-CM | POA: Diagnosis not present

## 2022-07-01 DIAGNOSIS — L0291 Cutaneous abscess, unspecified: Secondary | ICD-10-CM | POA: Diagnosis not present

## 2022-08-20 DIAGNOSIS — Q828 Other specified congenital malformations of skin: Secondary | ICD-10-CM | POA: Diagnosis not present

## 2022-08-20 DIAGNOSIS — L110 Acquired keratosis follicularis: Secondary | ICD-10-CM | POA: Diagnosis not present

## 2022-08-20 DIAGNOSIS — M79672 Pain in left foot: Secondary | ICD-10-CM | POA: Diagnosis not present

## 2022-08-20 DIAGNOSIS — M79671 Pain in right foot: Secondary | ICD-10-CM | POA: Diagnosis not present

## 2022-09-01 ENCOUNTER — Encounter: Payer: Self-pay | Admitting: Adult Health

## 2022-09-01 ENCOUNTER — Other Ambulatory Visit (HOSPITAL_COMMUNITY)
Admission: RE | Admit: 2022-09-01 | Discharge: 2022-09-01 | Disposition: A | Discharge status: Home / Self Care | Payer: Federal, State, Local not specified - PPO | Source: Ambulatory Visit | Attending: Adult Health | Admitting: Adult Health

## 2022-09-01 ENCOUNTER — Ambulatory Visit (INDEPENDENT_AMBULATORY_CARE_PROVIDER_SITE_OTHER): Payer: Federal, State, Local not specified - PPO | Admitting: Adult Health

## 2022-09-01 VITALS — BP 131/86 | HR 69 | Ht 67.0 in | Wt 154.5 lb

## 2022-09-01 DIAGNOSIS — N390 Urinary tract infection, site not specified: Secondary | ICD-10-CM | POA: Diagnosis not present

## 2022-09-01 DIAGNOSIS — R350 Frequency of micturition: Secondary | ICD-10-CM

## 2022-09-01 DIAGNOSIS — Z1211 Encounter for screening for malignant neoplasm of colon: Secondary | ICD-10-CM | POA: Diagnosis not present

## 2022-09-01 DIAGNOSIS — Z01419 Encounter for gynecological examination (general) (routine) without abnormal findings: Secondary | ICD-10-CM

## 2022-09-01 DIAGNOSIS — N814 Uterovaginal prolapse, unspecified: Secondary | ICD-10-CM

## 2022-09-01 DIAGNOSIS — F17200 Nicotine dependence, unspecified, uncomplicated: Secondary | ICD-10-CM

## 2022-09-01 DIAGNOSIS — Z1212 Encounter for screening for malignant neoplasm of rectum: Secondary | ICD-10-CM

## 2022-09-01 DIAGNOSIS — R319 Hematuria, unspecified: Secondary | ICD-10-CM

## 2022-09-01 LAB — POCT URINALYSIS DIPSTICK
Glucose, UA: NEGATIVE
Ketones, UA: NEGATIVE
Leukocytes, UA: NEGATIVE
Nitrite, UA: POSITIVE
Protein, UA: NEGATIVE

## 2022-09-01 LAB — HEMOCCULT GUIAC POC 1CARD (OFFICE): Fecal Occult Blood, POC: NEGATIVE

## 2022-09-01 MED ORDER — NITROFURANTOIN MONOHYD MACRO 100 MG PO CAPS: 100.0000 mg | ORAL_CAPSULE | Freq: Two times a day (BID) | ORAL | 0 refills | Status: DC

## 2022-09-01 NOTE — Progress Notes (Signed)
Patient ID: [name], female   DOB: January 01, 1961, 62 y.o.   MRN: [id number] History of Present Illness: [name] is a 62 year old white female, married, PM in for a well woman gyn exam and pap.     Current Medications, Allergies, Past Medical History, Past Surgical History, Family History and Social History were reviewed in [name].     Review of Systems: Patient denies any headaches, hearing loss, fatigue, blurred vision, shortness of breath, chest pain, abdominal pain, problems with bowel movements,  or intercourse(not active). No joint pain or mood swings. Denies any vaginal bleeding.  +urinary frequency and odor   Physical Exam:BP 131/86 (BP Location: Right Arm, Patient Position: Sitting, Cuff Size: Normal)   Pulse 69   Ht 5\' 7"  (1.702 m)   Wt 154 lb 8 oz (70.1 kg)   BMI 24.20 kg/m  urine dipstick +nitrates, moderate blood. General:  Well developed, well nourished, no acute distress Skin:  Warm and dry Neck:  Midline trachea, normal thyroid, good ROM, no lymphadenopathy,no carotid bruits Lungs; Clear to auscultation bilaterally Breast:  No dominant palpable mass, retraction, or nipple discharge Cardiovascular: Regular rate and rhythm Abdomen:  Soft, non tender, no hepatosplenomegaly Pelvic:  External genitalia is normal in appearance, no lesions.  The vagina is pale,+cystocele  and uterine descensus. Urethra has no lesions or masses. The cervix is smooth, pap with HR HPV genotyping performed.  Uterus is felt to be normal size, shape, and contour.  No adnexal masses or tenderness noted.Bladder is non tender, no masses felt. Rectal: Good sphincter tone, no polyps, or hemorrhoids felt.  Hemoccult negative.+rectocele Extremities/musculoskeletal:  No swelling or varicosities noted, no clubbing or cyanosis Psych:  No mood changes, alert and cooperative,seems happy AA is 0 Fall risk is moderate    09/01/2022    3:39 PM 05/07/2016    3:37 PM  Depression  screen PHQ 2/9  Decreased Interest 1 0  Down, Depressed, Hopeless 0 0  PHQ - 2 Score 1 0  Altered sleeping 1   Tired, decreased energy 0   Change in appetite 0   Feeling bad or failure about yourself  0   Trouble concentrating 0   Moving slowly or fidgety/restless 0   Suicidal thoughts 0   PHQ-9 Score 2        09/01/2022    3:40 PM  GAD 7 : Generalized Anxiety Score  Nervous, Anxious, on Edge 0  Control/stop worrying 1  Worry too much - different things 1  Trouble relaxing 1  Restless 0  Easily annoyed or irritable 1  Afraid - awful might happen 0  Total GAD 7 Score 4      Upstream - 09/01/22 1552       Pregnancy Intention Screening   Does the patient want to become pregnant in the next year? N/A    Does the patient's partner want to become pregnant in the next year? N/A    Would the patient like to discuss contraceptive options today? N/A      Contraception Wrap Up   Current Method Abstinence    End Method Abstinence    Contraception Counseling Provided No            Examination chaperoned by Malachy Mood LPN   Impression and Plan: 1. Encounter for gynecological examination with Papanicolaou smear of cervix Pap sent Pap in 3 years if negative Physical in 1 year Mammogram was negative 02/12/22 in [place] with PCP  -  Cytology - PAP( [name]  2. Urinary frequency +nitrates UA C& Sent  - POCT Urinalysis Dipstick - Urine Culture - Urinalysis, Routine w reflex microscopic Rx sent for Macrobid Meds ordered this encounter  Medications   nitrofurantoin, macrocrystal-monohydrate, (MACROBID) 100 MG capsule    Sig: Take 1 capsule (100 mg total) by mouth 2 (two) times daily.    Dispense:  14 capsule    Refill:  0    Order Specific Question:   Supervising Provider    Answer:   Despina Hidden, LUTHER H [[id number]]    3. Cystocele with small rectocele and uterine descent  4. Encounter for screening fecal occult blood testing Hemoccult was negative  - POCT occult  blood stool  5. Screening for colorectal cancer Will do cologuard  - Cologuard  6. Urinary tract infection with hematuria, site unspecified Rx Macrobid  7. Smoker She declines to stop

## 2022-09-02 LAB — MICROSCOPIC EXAMINATION: Casts: NONE SEEN /lpf

## 2022-09-02 LAB — URINALYSIS, ROUTINE W REFLEX MICROSCOPIC
Bilirubin, UA: NEGATIVE
Glucose, UA: NEGATIVE
Ketones, UA: NEGATIVE
Leukocytes,UA: NEGATIVE
Nitrite, UA: POSITIVE — AB
Protein,UA: NEGATIVE
Specific Gravity, UA: 1.013 (ref 1.005–1.030)
Urobilinogen, Ur: 0.2 mg/dL (ref 0.2–1.0)
pH, UA: 6 (ref 5.0–7.5)

## 2022-09-03 LAB — CYTOLOGY - PAP
Comment: NEGATIVE
Diagnosis: NEGATIVE
High risk HPV: NEGATIVE

## 2022-09-05 LAB — URINE CULTURE

## 2022-09-16 ENCOUNTER — Other Ambulatory Visit (INDEPENDENT_AMBULATORY_CARE_PROVIDER_SITE_OTHER): Payer: Federal, State, Local not specified - PPO | Admitting: *Deleted

## 2022-09-16 DIAGNOSIS — Z87440 Personal history of urinary (tract) infections: Secondary | ICD-10-CM | POA: Diagnosis not present

## 2022-09-16 DIAGNOSIS — R8290 Unspecified abnormal findings in urine: Secondary | ICD-10-CM | POA: Diagnosis not present

## 2022-09-16 LAB — POCT URINALYSIS DIPSTICK OB
Glucose, UA: NEGATIVE
Ketones, UA: NEGATIVE
Leukocytes, UA: NEGATIVE
Nitrite, UA: POSITIVE
POC,PROTEIN,UA: NEGATIVE

## 2022-09-16 NOTE — Progress Notes (Signed)
   NURSE VISIT- UTI SYMPTOMS   SUBJECTIVE:  [name] is a 62 y.o. [phone] female here for UTI symptoms. She is a GYN patient. She reports  urine odor, recently treated for uti .  OBJECTIVE:  There were no vitals taken for this visit.  Appears well, in no apparent distress  No results found for this or any previous visit (from the past 24 hour(s)).  ASSESSMENT: GYN patient with UTI symptoms and positive nitrites  PLAN: Note routed to Cyril Mourning, AGNP   Rx sent by provider today: No Urine culture sent Call or return to clinic prn if these symptoms worsen or fail to improve as anticipated. Follow-up: as needed   Annamarie Dawley  09/16/2022 2:01 PM

## 2022-09-17 LAB — URINALYSIS
Bilirubin, UA: NEGATIVE
Glucose, UA: NEGATIVE
Ketones, UA: NEGATIVE
Leukocytes,UA: NEGATIVE
Nitrite, UA: POSITIVE — AB
Specific Gravity, UA: 1.022 (ref 1.005–1.030)
Urobilinogen, Ur: 0.2 mg/dL (ref 0.2–1.0)
pH, UA: 6.5 (ref 5.0–7.5)

## 2022-09-21 ENCOUNTER — Other Ambulatory Visit: Payer: Self-pay | Admitting: Adult Health

## 2022-09-21 LAB — URINE CULTURE

## 2022-09-21 MED ORDER — CIPROFLOXACIN HCL 500 MG PO TABS: 500.0000 mg | ORAL_TABLET | Freq: Two times a day (BID) | ORAL | 0 refills | Status: DC

## 2022-09-21 MED ORDER — NITROFURANTOIN MONOHYD MACRO 100 MG PO CAPS: 100.0000 mg | ORAL_CAPSULE | Freq: Two times a day (BID) | ORAL | 0 refills | Status: DC

## 2022-09-21 NOTE — Progress Notes (Signed)
Pt can't take cipro, will rx macrobid

## 2022-10-08 DIAGNOSIS — L110 Acquired keratosis follicularis: Secondary | ICD-10-CM | POA: Diagnosis not present

## 2022-10-08 DIAGNOSIS — M79671 Pain in right foot: Secondary | ICD-10-CM | POA: Diagnosis not present

## 2022-10-08 DIAGNOSIS — L565 Disseminated superficial actinic porokeratosis (DSAP): Secondary | ICD-10-CM | POA: Diagnosis not present

## 2022-10-21 ENCOUNTER — Telehealth: Payer: Self-pay | Admitting: Cardiology

## 2022-10-21 NOTE — Telephone Encounter (Signed)
   Pre-operative Risk Assessment    Patient Name: [name]  DOB: 07-17-1960 MRN: [id number]     Request for Surgical Clearance    Procedure:  Dental Extraction - Amount of Teeth to be Pulled:  1  Date of Surgery:  Clearance 11/02/22                                 Surgeon:  Wilford Grist DDS Surgeon's Group or Practice Name:  [facility]  Phone number:  [phone] Fax number:  [phone]   Type of Clearance Requested:   - Medical  - Pharmacy:  Hold TBD  by Cardiology   Type of Anesthesia:  Local    Additional requests/questions:  Does this patient need antibiotics?  Minna Antis   10/21/2022, 8:30 AM

## 2022-10-21 NOTE — Telephone Encounter (Signed)
I returned pt phone call. I went over KL note from clearance.Marland Kitchen  "Chart reviewed as part of pre-operative protocol coverage.    IF SIMPLE EXTRACTION/CLEANINGS: Simple dental extractions (i.e. 1-2 teeth) are considered low risk procedures per guidelines and generally do not require any specific cardiac clearance. It is also generally accepted that for simple extractions and dental cleanings,she will not  need antibiotics.    Please call with questions.   Joni Reining, NP 10/21/2022, 8:55 AM "  Pt voiced understanding and will call if any other concerns.

## 2022-10-21 NOTE — Telephone Encounter (Signed)
Patient is calling to follow up on clearance. Patient is requesting for antibiotics for the procedure.

## 2022-10-21 NOTE — Telephone Encounter (Signed)
   Patient Name: [name]  DOB: Sep 14, 1960 MRN: [id number]  Primary Cardiologist: Nona Dell, MD  Chart reviewed as part of pre-operative protocol coverage.   IF SIMPLE EXTRACTION/CLEANINGS: Simple dental extractions (i.e. 1-2 teeth) are considered low risk procedures per guidelines and generally do not require any specific cardiac clearance. It is also generally accepted that for simple extractions and dental cleanings,she will not  need antibiotics.   Please call with questions.  Joni Reining, NP 10/21/2022, 8:55 AM

## 2022-11-26 ENCOUNTER — Ambulatory Visit: Payer: Federal, State, Local not specified - PPO | Attending: Nurse Practitioner | Admitting: Nurse Practitioner

## 2022-11-26 ENCOUNTER — Encounter: Payer: Self-pay | Admitting: Nurse Practitioner

## 2022-11-26 VITALS — BP 120/74 | HR 68 | Ht 67.0 in | Wt 146.4 lb

## 2022-11-26 DIAGNOSIS — Z87898 Personal history of other specified conditions: Secondary | ICD-10-CM

## 2022-11-26 DIAGNOSIS — M79674 Pain in right toe(s): Secondary | ICD-10-CM | POA: Diagnosis not present

## 2022-11-26 DIAGNOSIS — I10 Essential (primary) hypertension: Secondary | ICD-10-CM | POA: Diagnosis not present

## 2022-11-26 DIAGNOSIS — I471 Supraventricular tachycardia, unspecified: Secondary | ICD-10-CM

## 2022-11-26 DIAGNOSIS — L110 Acquired keratosis follicularis: Secondary | ICD-10-CM | POA: Diagnosis not present

## 2022-11-26 DIAGNOSIS — L565 Disseminated superficial actinic porokeratosis (DSAP): Secondary | ICD-10-CM | POA: Diagnosis not present

## 2022-11-26 DIAGNOSIS — M79671 Pain in right foot: Secondary | ICD-10-CM | POA: Diagnosis not present

## 2022-11-26 MED ORDER — METOPROLOL TARTRATE 50 MG PO TABS: 50.0000 mg | ORAL_TABLET | Freq: Two times a day (BID) | ORAL | 6 refills | Status: DC

## 2022-11-26 NOTE — Progress Notes (Signed)
Cardiology Office Note:  .   Date:  11/26/2022 ID:  [name], DOB October 04, 1960, MRN [id number] PCP: Catalina Lunger, DO   [facility] Providers Cardiologist:  Nona Dell, MD    History of Present Illness: .   [name] is a 62 y.o. female with a PMH of palpitations, PSVT, hypertension, anxiety, depression, who is today for 1 year follow-up.  Last seen by Dr. Diona Browner on Jul 16, 2021.  She was doing well at the time.  Today she presents for 1 year follow-up.  She states she is doing well. Denies any changes to her health since last office visit. Denies any chest pain, shortness of breath, palpitations, syncope, presyncope, dizziness, orthopnea, PND, swelling or significant weight changes, acute bleeding, or claudication.   ROS: See HPI. Rest of ROS is negative.   Studies Reviewed: Marland Kitchen    EKG: EKG Interpretation Date/Time:  Thursday November 26 2022 14:28:01 EDT Ventricular Rate:  70 PR Interval:  162 QRS Duration:  72 QT Interval:  390 QTC Calculation: 421 R Axis:   78  Text Interpretation: Normal sinus rhythm Normal ECG No previous ECGs available Confirmed by Sharlene Dory [phone]) on 11/26/2022 3:14:51 PM   Physical Exam:   VS:  BP 120/74   Pulse 68   Ht 5\' 7"  (1.702 m)   Wt 146 lb 6.4 oz (66.4 kg)   SpO2 96%   BMI 22.93 kg/m    Wt Readings from Last 3 Encounters:  11/26/22 146 lb 6.4 oz (66.4 kg)  09/01/22 154 lb 8 oz (70.1 kg)  07/16/21 152 lb 12.8 oz (69.3 kg)    GEN: Well nourished, well developed in no acute distress NECK: No JVD; No carotid bruits CARDIAC: S1/S2, RRR, no murmurs, rubs, gallops RESPIRATORY:  Clear to auscultation without rales, wheezing or rhonchi  ABDOMEN: Soft, non-tender, non-distended EXTREMITIES:  No edema; No deformity   ASSESSMENT AND PLAN: .    Hx of palpitations, PSVT Doing well and denies any palpitations. EKG shows NSR. Continue Lopressor and will provide refill. Will request labs from PCP's office. Heart healthy  diet and regular cardiovascular exercise encouraged.   HTN BP stable and at goal. Continue Lopressor - will provide refill. Will request most recent labs from PCP's office. Heart healthy diet and regular cardiovascular exercise encouraged.   Dispo: Follow-up in 1 year with Dr. Diona Browner or APP or sooner if anything changes.   Signed, Sharlene Dory, NP

## 2022-11-26 NOTE — Patient Instructions (Addendum)

## 2022-12-01 ENCOUNTER — Encounter: Payer: Self-pay | Admitting: Nurse Practitioner

## 2023-01-21 ENCOUNTER — Ambulatory Visit: Payer: Federal, State, Local not specified - PPO | Admitting: Podiatry

## 2023-02-04 ENCOUNTER — Encounter: Payer: Self-pay | Admitting: Podiatry

## 2023-02-04 ENCOUNTER — Ambulatory Visit: Payer: Federal, State, Local not specified - PPO | Admitting: Podiatry

## 2023-02-04 DIAGNOSIS — F411 Generalized anxiety disorder: Secondary | ICD-10-CM | POA: Insufficient documentation

## 2023-02-04 DIAGNOSIS — D2371 Other benign neoplasm of skin of right lower limb, including hip: Secondary | ICD-10-CM

## 2023-02-04 DIAGNOSIS — M2041 Other hammer toe(s) (acquired), right foot: Secondary | ICD-10-CM

## 2023-02-04 NOTE — Progress Notes (Signed)
  Subjective:  Patient ID: [name], female    DOB: 01-22-1961,  MRN: [id number] HPI Chief Complaint  Patient presents with   Toe Pain    3rd toe right - callused area x years, was seeing Dr. Alberteen Spindle in [place] and he took it out, but it come back, tried filing it at home, also getting one plantar heel too   New Patient (Initial Visit)    62 y.o. female presents with the above complaint.   ROS: Denies fever chills nausea vomit muscle aches pains calf pain back pain chest pain shortness of breath.  Past Medical History:  Diagnosis Date   Anxiety    Cystocele    Depression    Fibroadenoma of breast    Fibroids    History of HPV infection    History of hypertension    During 3rd pregnancy   Hypothyroidism    Ovarian cyst    Paroxysmal supraventricular tachycardia ([facility])    Rectocele    Urinary incontinence    Past Surgical History:  Procedure Laterality Date   BOIL REMOVAL  1996   From leg    Current Outpatient Medications:    acetaminophen (TYLENOL) 500 MG tablet, Take 1,000 mg by mouth as needed for pain., Disp: , Rfl:    buPROPion (WELLBUTRIN SR) 150 MG 12 hr tablet, Take one by mouth daily, Disp: , Rfl:    levothyroxine (SYNTHROID, LEVOTHROID) 88 MCG tablet, Take 88 mcg by mouth daily before breakfast., Disp: , Rfl:    metoprolol tartrate (LOPRESSOR) 50 MG tablet, Take 1 tablet (50 mg total) by mouth 2 (two) times daily., Disp: 60 tablet, Rfl: 6  Allergies  Allergen Reactions   Codeine     REACTION: made head feel funny   Diphenhydramine Other (See Comments)    Benadryl-heart races   Erythromycin     rash   Penicillins     REACTION: rash   Sulfa Antibiotics Hives   Ibuprofen Other (See Comments)    Facial swelling   Review of Systems Objective:  There were no vitals filed for this visit.  General: Well developed, nourished, in no acute distress, alert and oriented x3   Dermatological: Skin is warm, dry and supple bilateral. Nails x 10 are well maintained;  remaining integument appears unremarkable at this time. There are no open sores, no preulcerative lesions, no rash or signs of infection present.  Vascular: Dorsalis Pedis artery and Posterior Tibial artery pedal pulses are 2/4 bilateral with immedate capillary fill time. Pedal hair growth present. No varicosities and no lower extremity edema present bilateral.   Neruologic: Grossly intact via light touch bilateral. Vibratory intact via tuning fork bilateral. Protective threshold with Semmes Wienstein monofilament intact to all pedal sites bilateral. Patellar and Achilles deep tendon reflexes 2+ bilateral. No Babinski or clonus noted bilateral.   Musculoskeletal: No gross boney pedal deformities bilateral. No pain, crepitus, or limitation noted with foot and ankle range of motion bilateral. Muscular strength 5/5 in all groups tested bilateral.  Gait: Unassisted, Nonantalgic.    Radiographs:  None taken  Assessment & Plan:   Assessment: Poor keratoma third toe right foot.  Plan: Discussed etiology pathology conservative therapies debrided both lesions today for her I will follow-up with her on an as-needed basis.     Madysen Faircloth T. [place], [place]

## 2023-04-30 DIAGNOSIS — E785 Hyperlipidemia, unspecified: Secondary | ICD-10-CM | POA: Diagnosis not present

## 2023-04-30 DIAGNOSIS — I471 Supraventricular tachycardia, unspecified: Secondary | ICD-10-CM | POA: Diagnosis not present

## 2023-04-30 DIAGNOSIS — E039 Hypothyroidism, unspecified: Secondary | ICD-10-CM | POA: Diagnosis not present

## 2023-04-30 DIAGNOSIS — Z0000 Encounter for general adult medical examination without abnormal findings: Secondary | ICD-10-CM | POA: Diagnosis not present

## 2023-07-04 ENCOUNTER — Other Ambulatory Visit: Payer: Self-pay | Admitting: Nurse Practitioner

## 2023-07-06 ENCOUNTER — Ambulatory Visit: Admitting: Podiatry

## 2023-07-21 DIAGNOSIS — E039 Hypothyroidism, unspecified: Secondary | ICD-10-CM | POA: Diagnosis not present

## 2023-07-21 DIAGNOSIS — E785 Hyperlipidemia, unspecified: Secondary | ICD-10-CM | POA: Diagnosis not present

## 2023-07-21 DIAGNOSIS — R197 Diarrhea, unspecified: Secondary | ICD-10-CM | POA: Diagnosis not present

## 2023-07-21 DIAGNOSIS — Z131 Encounter for screening for diabetes mellitus: Secondary | ICD-10-CM | POA: Diagnosis not present

## 2023-07-21 DIAGNOSIS — Z0000 Encounter for general adult medical examination without abnormal findings: Secondary | ICD-10-CM | POA: Diagnosis not present

## 2023-07-21 DIAGNOSIS — R300 Dysuria: Secondary | ICD-10-CM | POA: Diagnosis not present

## 2023-07-29 ENCOUNTER — Encounter (INDEPENDENT_AMBULATORY_CARE_PROVIDER_SITE_OTHER): Payer: Self-pay | Admitting: *Deleted

## 2023-08-18 ENCOUNTER — Ambulatory Visit (INDEPENDENT_AMBULATORY_CARE_PROVIDER_SITE_OTHER): Admitting: Gastroenterology

## 2023-09-01 ENCOUNTER — Other Ambulatory Visit: Payer: Self-pay | Admitting: *Deleted

## 2023-09-01 ENCOUNTER — Ambulatory Visit (INDEPENDENT_AMBULATORY_CARE_PROVIDER_SITE_OTHER): Admitting: Gastroenterology

## 2023-09-01 ENCOUNTER — Encounter (INDEPENDENT_AMBULATORY_CARE_PROVIDER_SITE_OTHER): Payer: Self-pay | Admitting: Gastroenterology

## 2023-09-01 VITALS — BP 129/80 | HR 76 | Temp 97.1°F | Ht 66.5 in | Wt 148.0 lb

## 2023-09-01 DIAGNOSIS — K5900 Constipation, unspecified: Secondary | ICD-10-CM | POA: Diagnosis not present

## 2023-09-01 DIAGNOSIS — R197 Diarrhea, unspecified: Secondary | ICD-10-CM

## 2023-09-01 DIAGNOSIS — K5904 Chronic idiopathic constipation: Secondary | ICD-10-CM | POA: Insufficient documentation

## 2023-09-01 DIAGNOSIS — K581 Irritable bowel syndrome with constipation: Secondary | ICD-10-CM

## 2023-09-01 DIAGNOSIS — R109 Unspecified abdominal pain: Secondary | ICD-10-CM | POA: Diagnosis not present

## 2023-09-01 MED ORDER — POLYETHYLENE GLYCOL 3350 17 G PO PACK: 17.0000 g | PACK | Freq: Two times a day (BID) | ORAL | 0 refills | Status: AC

## 2023-09-01 MED ORDER — LUBIPROSTONE 8 MCG PO CAPS
24.0000 ug | ORAL_CAPSULE | Freq: Two times a day (BID) | ORAL | 5 refills | Status: AC
Start: 2023-09-01 — End: 2024-02-28

## 2023-09-01 NOTE — Progress Notes (Signed)
 [name] , M.D. Gastroenterology & Hepatology [facility] Gastroenterology [address] [place], [place] [address] Primary Care Physician: Tara Farr, FNP [address] [place] [address]  Chief Complaint: Abdominal discomfort with alternating bowel with constipation and diarrhea  History of Present Illness: [name] is a 63 y.o. female with palpitations, PSVT, hypertension, anxiety, depression,  who presents for evaluation of Abdominal discomfort with alternating bowel with constipation and diarrhea  Patient reports that most of her life she has suffered from constipation where she would have 1 bowel movement every 1 to 2 weeks.  Recently patient is having more postprandial bowel movements small and frequent.  Patient does report abdominal discomfort which is relieved with defecation.  Her bowel movements with alternating between Bristol stool scale 1-7 the patient denies having any nausea, vomiting, fever, chills, hematochezia, melena, hematemesis, abdominal distention, abdominal pain,  jaundice, pruritus or weight loss.  Last UJW:JXBJ Last Colonoscopy:none  Labs from 07/2023 hemoglobin 14.2 platelet 373. TSH 0.6 Creatinine 0.82 normal liver enzymes Hemoglobin A1c 5.7  Negative Cologuard test done 9 months ago 10/2022  FHx: neg for any gastrointestinal/liver disease, no malignancies Social: neg smoking, alcohol or illicit drug use Surgical: no abdominal surgeries  Past Medical History: Past Medical History:  Diagnosis Date   Anxiety    Cystocele    Depression    Fibroadenoma of breast    Fibroids    History of HPV infection    History of hypertension    During 3rd pregnancy   Hypothyroidism    Ovarian cyst    Paroxysmal supraventricular tachycardia (HCC)    Rectocele    Urinary incontinence     Past Surgical History: Past Surgical History:  Procedure Laterality Date   BOIL REMOVAL  1996   From leg     Family History: Family History  Problem Relation Age of Onset   Thyroid  disease Maternal Grandmother    Hypertension Maternal Grandfather    Breast cancer Mother    Lung cancer Mother    Cancer Mother        lung   Heart disease Brother    COPD Sister    Other Son        gunshot    Social History: Social History   Tobacco Use  Smoking Status Every Day   Current packs/day: 1.00   Average packs/day: 1 pack/day for 30.0 years (30.0 ttl pk-yrs)   Types: Cigarettes  Smokeless Tobacco Never   Social History   Substance and Sexual Activity  Alcohol Use No   Alcohol/week: 0.0 standard drinks of alcohol   Social History   Substance and Sexual Activity  Drug Use No    Allergies: Allergies  Allergen Reactions   Codeine     REACTION: made head feel funny   Diphenhydramine Other (See Comments)    Benadryl-heart races   Erythromycin     rash   Penicillins     REACTION: rash   Sulfa Antibiotics Hives   Ibuprofen Other (See Comments)    Facial swelling    Medications: Current Outpatient Medications  Medication Sig Dispense Refill   acetaminophen (TYLENOL) 500 MG tablet Take 1,000 mg by mouth as needed for pain.     buPROPion (WELLBUTRIN SR) 150 MG 12 hr tablet Take one by mouth daily     levothyroxine (SYNTHROID, LEVOTHROID) 88 MCG tablet Take 88 mcg by mouth daily before breakfast.     metoprolol  tartrate (LOPRESSOR )  50 MG tablet TAKE 1 TABLET BY MOUTH TWICE DAILY 60 tablet 6   No current facility-administered medications for this visit.    Review of Systems: GENERAL: negative for malaise, night sweats HEENT: No changes in hearing or vision, no nose bleeds or other nasal problems. NECK: Negative for lumps, goiter, pain and significant neck swelling RESPIRATORY: Negative for cough, wheezing CARDIOVASCULAR: Negative for chest pain, leg swelling, palpitations, orthopnea GI: SEE HPI MUSCULOSKELETAL: Negative for joint pain or swelling, back pain, and muscle  pain. SKIN: Negative for lesions, rash HEMATOLOGY Negative for prolonged bleeding, bruising easily, and swollen nodes. ENDOCRINE: Negative for cold or heat intolerance, polyuria, polydipsia and goiter. NEURO: negative for tremor, gait imbalance, syncope and seizures. The remainder of the review of systems is noncontributory.   Physical Exam: BP 129/80 (BP Location: Left Arm, Patient Position: Sitting, Cuff Size: Normal)   Pulse 76   Temp (!) 97.1 F (36.2 C) (Temporal)   Ht 5' 6.5 (1.689 m)   Wt 148 lb (67.1 kg)   BMI 23.53 kg/m  GENERAL: The patient is AO x3, in no acute distress. HEENT: Head is normocephalic and atraumatic. EOMI are intact. Mouth is well hydrated and without lesions. NECK: Supple. No masses LUNGS: Clear to auscultation. No presence of rhonchi/wheezing/rales. Adequate chest expansion HEART: RRR, normal s1 and s2. ABDOMEN: Soft, nontender, no guarding, no peritoneal signs, and nondistended. BS +. No masses.   Imaging/Labs: as above     Latest Ref Rng & Units 04/07/2016    4:00 PM 09/06/2012    3:50 PM  CBC  WBC 3.8 - 10.8 K/uL 9.7  9.8   Hemoglobin 11.7 - 15.5 g/dL 54.0  98.1   Hematocrit 35.0 - 45.0 % 40.3  40.6   Platelets 140 - 400 K/uL 364  381    No results found for: IRON, TIBC, FERRITIN  I personally reviewed and interpreted the available labs, imaging and endoscopic files.  Impression and Plan: [name] is a 63 y.o. female with palpitations, PSVT, hypertension, anxiety, depression,  who presents for evaluation of Abdominal discomfort with alternating bowel with constipation and diarrhea  #Abdominal discomfort with alternating bowel with constipation and diarrhea  This could be chronic idiopathic constipation or irritable bowel syndrome constipation type or overflow diarrhea  Patient has abdominal discomfort relieved with defecation with alternating bowel movements typical of IBS-C  Although she does have severe constipation with  adequate bowel movements every 1 to 2 weeks  Postprandial diarrhea could be overflow diarrhea  Would start by obtaining abdominal x-ray to evaluate stool burden, CRP, fecal calprotectin, celiac serologies  Diet is low in fluid and fiber and we will start by lifestyle changes Follow a high fiber diet: Include foods such as dates, prunes, pears, and kiwi. Take Miralax twice a day for the first week, then reduce to once daily thereafter. Use Metamucil twice a day. Will start patient on Amitiza as Linzess does not appear to be covered by patient insurance  If above does not improve patient's situation may benefit from BET with Anal manometry to rule out defecatory disorder  HCM: Patient is up-to-date to age-appropriate colon cancer screening with a negative Cologuard performed 9 months ago  All questions were answered.      Shamari Lofquist Faizan Cheree Fowles, MD Gastroenterology and Hepatology [facility] Gastroenterology   This chart has been completed using [facility] Dictation software, and while attempts have been made to ensure accuracy , certain words and phrases may not be transcribed  as intended

## 2023-09-01 NOTE — Patient Instructions (Signed)
 It was very nice to meet you today, as dicussed with will plan for the following :  1)Obtain abdominal xray   Ensure adequate fluid intake: Aim for 8 glasses of water daily. Follow a high fiber diet: Include foods such as dates, prunes, pears, and kiwi. Take Miralax twice a day for the first week, then reduce to once daily thereafter. Use Metamucil twice a day.  2) Amitiza twice daily   3) Blood work and stool samples

## 2023-09-02 ENCOUNTER — Telehealth (INDEPENDENT_AMBULATORY_CARE_PROVIDER_SITE_OTHER): Payer: Self-pay | Admitting: Gastroenterology

## 2023-09-02 LAB — IGA: IgA/Immunoglobulin A, Serum: 259 mg/dL (ref 87–352)

## 2023-09-02 LAB — C-REACTIVE PROTEIN: CRP: <1 mg/L (ref 0–10)

## 2023-09-02 NOTE — Telephone Encounter (Signed)
 FYI Fax stating PA for 90 days (540 tabs)denied but pt is approved for the max quantity that can be obtained through [facility] service benefit plan coverage.  We can complete an appeal if we would like pt to have 90 day supply.   Approval valid 08/03/23-09/01/24.

## 2023-09-02 NOTE — Telephone Encounter (Signed)
 PA completed via Cover My Meds. Awaiting decision from insurance. Also sent recent office visit via Cover My Meds

## 2023-09-05 LAB — TISSUE TRANSGLUTAMINASE ABS,IGG,IGA

## 2023-09-06 NOTE — Telephone Encounter (Signed)
 Yes sir that is correct. Max would be one month supply

## 2023-09-06 NOTE — Telephone Encounter (Signed)
 Noted. Both approvals and denials will be scanned in chart

## 2023-09-07 ENCOUNTER — Ambulatory Visit (INDEPENDENT_AMBULATORY_CARE_PROVIDER_SITE_OTHER): Payer: Self-pay | Admitting: Gastroenterology

## 2023-09-07 NOTE — Progress Notes (Signed)
 CRP<1  Normal IgA and negative celiac panel

## 2023-09-09 ENCOUNTER — Encounter: Payer: Self-pay | Admitting: Adult Health

## 2023-09-09 ENCOUNTER — Ambulatory Visit: Admitting: Adult Health

## 2023-09-09 VITALS — BP 122/76 | HR 71 | Ht 66.5 in | Wt 146.5 lb

## 2023-09-09 DIAGNOSIS — F17200 Nicotine dependence, unspecified, uncomplicated: Secondary | ICD-10-CM

## 2023-09-09 DIAGNOSIS — R350 Frequency of micturition: Secondary | ICD-10-CM

## 2023-09-09 DIAGNOSIS — N816 Rectocele: Secondary | ICD-10-CM

## 2023-09-09 DIAGNOSIS — Z01419 Encounter for gynecological examination (general) (routine) without abnormal findings: Secondary | ICD-10-CM

## 2023-09-09 DIAGNOSIS — Z1211 Encounter for screening for malignant neoplasm of colon: Secondary | ICD-10-CM

## 2023-09-09 DIAGNOSIS — Z1331 Encounter for screening for depression: Secondary | ICD-10-CM

## 2023-09-09 DIAGNOSIS — N812 Incomplete uterovaginal prolapse: Secondary | ICD-10-CM

## 2023-09-09 LAB — POCT URINALYSIS DIPSTICK
Glucose, UA: NEGATIVE
Ketones, UA: NEGATIVE
Leukocytes, UA: NEGATIVE
Nitrite, UA: NEGATIVE
Protein, UA: NEGATIVE

## 2023-09-09 LAB — HEMOCCULT GUIAC POC 1CARD (OFFICE): Fecal Occult Blood, POC: NEGATIVE

## 2023-09-09 NOTE — Progress Notes (Signed)
 Patient ID: [name], female   DOB: 07-Jun-1960, 63 y.o.   MRN: [id number] History of Present Illness: [name] is a 63 year old white female, married, PM in for a well woman gyn exam. She is complaining of urinary frequency was treated with Macrobid  recently for UTI. 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     Component Value Date/Time   DIAGPAP  09/01/2022 1554    - Negative for intraepithelial lesion or malignancy (NILM)   DIAGPAP  05/07/2016 0000    NEGATIVE FOR INTRAEPITHELIAL LESIONS OR MALIGNANCY.   HPVHIGH Negative 09/01/2022 1554   ADEQPAP  09/01/2022 1554    Satisfactory for evaluation; transformation zone component PRESENT.   ADEQPAP  05/07/2016 0000    Satisfactory for evaluation  endocervical/transformation zone component PRESENT.     Current Medications, Allergies, Past Medical History, Past Surgical History, Family History and Social History were reviewed in [name].     Review of Systems: Patient denies any headaches, hearing loss, fatigue, blurred vision, shortness of breath, chest pain, abdominal pain, problems with bowel movements(has ?IBS, can eat and have diarrhea, has seen GI),  or intercourse(not active). No joint pain or mood swings.  Denies any vaginal bleeding +urinary frequency    Physical Exam:BP 122/76 (BP Location: Left Arm, Patient Position: Sitting, Cuff Size: Normal)   Pulse 71   Ht 5' 6.5 (1.689 m)   Wt 146 lb 8 oz (66.5 kg)   BMI 23.29 kg/m  urine dipstick 1+blood  General:  Well developed, well nourished, no acute distress Skin:  Warm and dry Neck:  Midline trachea, normal thyroid , good ROM, no lymphadenopathy,no carotid bruits heard  Lungs; Clear to auscultation bilaterally Breast:  No dominant palpable mass, retraction, or nipple discharge, has comedone sternum Has skin tag near right breast Cardiovascular: Regular rate and rhythm Abdomen:  Soft, non tender, no hepatosplenomegaly Pelvic:  External genitalia is  normal in appearance, no lesions.  The vagina is pale, cystocele, with prolapse(has pessary but does not use). Urethra has no lesions or masses. The cervix is smooth.  Uterus is felt to be normal size, shape, and contour.  No adnexal masses or tenderness noted.Bladder is non tender, no masses felt. Rectal: Good sphincter tone, no polyps, or hemorrhoids felt.  Hemoccult negative.+rectocele Extremities/musculoskeletal:  No swelling or varicosities noted, no clubbing or cyanosis Psych:  No mood changes, alert and cooperative,seems happy AA is 0 Fall risk is low    09/09/2023    3:49 PM 09/01/2022    3:39 PM 05/07/2016    3:37 PM  Depression screen PHQ 2/9  Decreased Interest 0 1 0  Down, Depressed, Hopeless 0 0 0  PHQ - 2 Score 0 1 0  Altered sleeping 0 1   Tired, decreased energy 0 0   Change in appetite 0 0   Feeling bad or failure about yourself  0 0   Trouble concentrating 0 0   Moving slowly or fidgety/restless 0 0   Suicidal thoughts 0 0   PHQ-9 Score 0 2        09/09/2023    3:49 PM 09/01/2022    3:40 PM  GAD 7 : Generalized Anxiety Score  Nervous, Anxious, on Edge 0 0  Control/stop worrying 0 1  Worry too much - different things 0 1  Trouble relaxing 0 1  Restless 0 0  Easily annoyed or irritable 0 1  Afraid - awful might happen 0 0  Total GAD 7 Score  0 4      Upstream - 09/09/23 1558       Pregnancy Intention Screening   Does the patient want to become pregnant in the next year? N/A    Does the patient's partner want to become pregnant in the next year? N/A    Would the patient like to discuss contraceptive options today? N/A      Contraception Wrap Up   Current Method Abstinence   PM   End Method Abstinence   PM   Contraception Counseling Provided No          Examination chaperoned by Clarita Salt LPN  Impression and plan: 1. Urinary frequency Urine sent for UA C&S to rule out UTI  - POCT Urinalysis Dipstick - Urine Culture - Urinalysis, Routine w reflex  microscopic  2. Encounter for well woman exam with routine gynecological exam (Primary) Physical in 1 year Pap in 2027 Labs with PCP Mammogram was negative 02/02/22 at [facility]   3. Rectocele  4. Cystocele with second degree uterine prolapse  5. Encounter for screening fecal occult blood testing Hemoccult negative  - POCT occult blood stool  6. Smoker Not ready to quit yet

## 2023-09-10 LAB — URINALYSIS, ROUTINE W REFLEX MICROSCOPIC
Bilirubin, UA: NEGATIVE
Glucose, UA: NEGATIVE
Ketones, UA: NEGATIVE
Leukocytes,UA: NEGATIVE
Nitrite, UA: NEGATIVE
Protein,UA: NEGATIVE
RBC, UA: NEGATIVE
Specific Gravity, UA: 1.009 (ref 1.005–1.030)
Urobilinogen, Ur: 0.2 mg/dL (ref 0.2–1.0)
pH, UA: 6 (ref 5.0–7.5)

## 2023-09-11 LAB — URINE CULTURE

## 2023-09-13 ENCOUNTER — Ambulatory Visit: Payer: Self-pay | Admitting: Adult Health

## 2023-10-28 ENCOUNTER — Encounter (INDEPENDENT_AMBULATORY_CARE_PROVIDER_SITE_OTHER): Payer: Self-pay | Admitting: Gastroenterology

## 2024-02-07 ENCOUNTER — Other Ambulatory Visit: Payer: Self-pay | Admitting: Cardiology

## 2024-02-08 ENCOUNTER — Other Ambulatory Visit: Payer: Self-pay | Admitting: Cardiology

## 2024-02-09 ENCOUNTER — Other Ambulatory Visit: Payer: Self-pay | Admitting: Cardiology

## 2024-02-14 ENCOUNTER — Telehealth: Payer: Self-pay | Admitting: Cardiology

## 2024-02-14 NOTE — Telephone Encounter (Signed)
*  STAT* If patient is at the pharmacy, call can be transferred to refill team.   1. Which medications need to be refilled? (please list name of each medication and dose if known)   metoprolol tartrate (LOPRESSOR) 50 MG tablet    2. Which pharmacy/location (including street and city if local pharmacy) is medication to be sent to?  [facility] - [place], [place] - [address]      3. Do they need a 30 day or 90 day supply? 90 day

## 2024-02-15 NOTE — Telephone Encounter (Signed)
 Refill sent

## 2024-02-22 ENCOUNTER — Encounter: Payer: Self-pay | Admitting: Cardiology

## 2024-02-22 ENCOUNTER — Ambulatory Visit: Attending: Cardiology | Admitting: Cardiology

## 2024-02-22 VITALS — BP 130/80 | HR 62 | Ht 66.5 in | Wt 152.8 lb

## 2024-02-22 DIAGNOSIS — I471 Supraventricular tachycardia, unspecified: Secondary | ICD-10-CM | POA: Diagnosis not present

## 2024-02-22 DIAGNOSIS — E039 Hypothyroidism, unspecified: Secondary | ICD-10-CM

## 2024-02-22 NOTE — Progress Notes (Signed)
    Cardiology Office Note  Date: 02/22/2024   ID: [name], DOB 03/02/1961, MRN [id number]  History of Present Illness: [name] is a 63 y.o. female last seen by Ms. Miriam NP in September 2024.  Our last visit was in 2023.  She is here for a routine visit.  Reports only an occasional sense of very brief palpitations, nothing progressive or prolonged.  No chest pain or syncope.  This is her 35th year working at the [employer].  We went over her medications.  She continues to do well on Lopressor  50 mg twice daily.  I reviewed lab work from May.  I reviewed her ECG today which shows normal sinus rhythm.  Physical Exam: VS:  BP 130/80 (BP Location: Left Arm, Patient Position: Sitting, Cuff Size: Normal)   Pulse 62   Ht 5' 6.5 (1.689 m)   Wt 152 lb 12.8 oz (69.3 kg)   SpO2 95%   BMI 24.29 kg/m , BMI Body mass index is 24.29 kg/m.  Wt Readings from Last 3 Encounters:  02/22/24 152 lb 12.8 oz (69.3 kg)  09/09/23 146 lb 8 oz (66.5 kg)  09/01/23 148 lb (67.1 kg)    General: Patient appears comfortable at rest. HEENT: Conjunctiva and lids normal. Neck: Supple, no elevated JVP or carotid bruits. Lungs: Clear to auscultation, nonlabored breathing at rest. Cardiac: Regular rate and rhythm, no S3 or significant systolic murmur.  ECG:  An ECG dated 11/26/2022 was personally reviewed today and demonstrated:  Sinus rhythm.  Labwork:  May 2025: Hemoglobin 14.2, platelets 373, hemoglobin A1c 5.7%, potassium 4.7, BUN 11, creatinine 0.82, TSH 3.661, cholesterol 123, triglycerides 91, HDL 48, LDL 62  Other Studies Reviewed Today:  No interval cardiac testing for review today.  Assessment and Plan:  1.  PSVT.  Doing very well with no progressive symptoms.  ECG reviewed and normal today.  Plan to continue Lopressor  50 mg twice daily and follow-up in 1 year.  2.  Hypothyroidism on Synthroid 88 mcg daily.  She continues to follow with PCP, LDL normal in May.  Disposition:  Follow  up 1 year.  Signed, Jayson JUDITHANN Sierras, M.D., F.A.C.C. [facility]

## 2024-02-22 NOTE — Patient Instructions (Signed)

## 2024-03-06 ENCOUNTER — Other Ambulatory Visit: Payer: Self-pay | Admitting: Cardiology
